# Patient Record
Sex: Female | Born: 1935 | Race: Black or African American | Hispanic: No | Marital: Married | Smoking: Never smoker
Health system: Southern US, Community
[De-identification: ages and names within clinical notes are randomized; demographics above are authoritative.]

## PROBLEM LIST (undated history)

## (undated) DIAGNOSIS — E785 Hyperlipidemia, unspecified: Secondary | ICD-10-CM

## (undated) DIAGNOSIS — I10 Essential (primary) hypertension: Secondary | ICD-10-CM

## (undated) DIAGNOSIS — E119 Type 2 diabetes mellitus without complications: Secondary | ICD-10-CM

## (undated) DIAGNOSIS — E039 Hypothyroidism, unspecified: Secondary | ICD-10-CM

## (undated) DIAGNOSIS — H409 Unspecified glaucoma: Secondary | ICD-10-CM

## (undated) DIAGNOSIS — F0390 Unspecified dementia without behavioral disturbance: Secondary | ICD-10-CM

## (undated) HISTORY — PX: ABDOMINAL HYSTERECTOMY: SHX81

## (undated) HISTORY — PX: CATARACT EXTRACTION, BILATERAL: SHX1313

## (undated) HISTORY — PX: BREAST REDUCTION SURGERY: SHX8

---

## 1997-04-17 ENCOUNTER — Ambulatory Visit (HOSPITAL_COMMUNITY): Admission: RE | Admit: 1997-04-17 | Discharge: 1997-04-17 | Payer: Self-pay | Admitting: Family Medicine

## 1997-09-11 ENCOUNTER — Ambulatory Visit (HOSPITAL_COMMUNITY): Admission: RE | Admit: 1997-09-11 | Discharge: 1997-09-11 | Payer: Self-pay | Admitting: Gastroenterology

## 1997-10-01 ENCOUNTER — Ambulatory Visit (HOSPITAL_COMMUNITY): Admission: RE | Admit: 1997-10-01 | Discharge: 1997-10-01 | Payer: Self-pay | Admitting: Gastroenterology

## 1997-10-01 ENCOUNTER — Encounter: Payer: Self-pay | Admitting: Gastroenterology

## 1998-01-23 ENCOUNTER — Encounter: Payer: Self-pay | Admitting: Ophthalmology

## 1998-01-23 ENCOUNTER — Ambulatory Visit (HOSPITAL_COMMUNITY): Admission: RE | Admit: 1998-01-23 | Discharge: 1998-01-23 | Payer: Self-pay | Admitting: Ophthalmology

## 1998-05-05 ENCOUNTER — Ambulatory Visit (HOSPITAL_COMMUNITY): Admission: RE | Admit: 1998-05-05 | Discharge: 1998-05-05 | Payer: Self-pay | Admitting: Family Medicine

## 1999-02-03 ENCOUNTER — Encounter: Admission: RE | Admit: 1999-02-03 | Discharge: 1999-05-04 | Payer: Self-pay | Admitting: Family Medicine

## 1999-05-06 ENCOUNTER — Ambulatory Visit (HOSPITAL_COMMUNITY): Admission: RE | Admit: 1999-05-06 | Discharge: 1999-05-06 | Payer: Self-pay | Admitting: Family Medicine

## 1999-05-06 ENCOUNTER — Encounter: Payer: Self-pay | Admitting: Family Medicine

## 2000-05-06 ENCOUNTER — Encounter: Payer: Self-pay | Admitting: Family Medicine

## 2000-05-06 ENCOUNTER — Ambulatory Visit (HOSPITAL_COMMUNITY): Admission: RE | Admit: 2000-05-06 | Discharge: 2000-05-06 | Payer: Self-pay | Admitting: Family Medicine

## 2000-10-10 ENCOUNTER — Encounter: Admission: RE | Admit: 2000-10-10 | Discharge: 2000-10-10 | Payer: Self-pay | Admitting: Family Medicine

## 2000-10-10 ENCOUNTER — Encounter: Payer: Self-pay | Admitting: Family Medicine

## 2001-03-15 ENCOUNTER — Ambulatory Visit (HOSPITAL_COMMUNITY): Admission: RE | Admit: 2001-03-15 | Discharge: 2001-03-15 | Payer: Self-pay | Admitting: Family Medicine

## 2001-05-08 ENCOUNTER — Ambulatory Visit (HOSPITAL_COMMUNITY): Admission: RE | Admit: 2001-05-08 | Discharge: 2001-05-08 | Payer: Self-pay | Admitting: Family Medicine

## 2001-05-08 ENCOUNTER — Encounter: Payer: Self-pay | Admitting: Family Medicine

## 2002-05-16 ENCOUNTER — Encounter: Payer: Self-pay | Admitting: Family Medicine

## 2002-05-16 ENCOUNTER — Ambulatory Visit (HOSPITAL_COMMUNITY): Admission: RE | Admit: 2002-05-16 | Discharge: 2002-05-16 | Payer: Self-pay | Admitting: Family Medicine

## 2003-05-20 ENCOUNTER — Ambulatory Visit (HOSPITAL_COMMUNITY): Admission: RE | Admit: 2003-05-20 | Discharge: 2003-05-20 | Payer: Self-pay | Admitting: Family Medicine

## 2003-11-28 ENCOUNTER — Encounter: Admission: RE | Admit: 2003-11-28 | Discharge: 2003-11-28 | Payer: Self-pay | Admitting: Family Medicine

## 2004-05-22 ENCOUNTER — Ambulatory Visit (HOSPITAL_COMMUNITY): Admission: RE | Admit: 2004-05-22 | Discharge: 2004-05-22 | Payer: Self-pay | Admitting: Family Medicine

## 2004-09-10 ENCOUNTER — Ambulatory Visit (HOSPITAL_COMMUNITY): Admission: RE | Admit: 2004-09-10 | Discharge: 2004-09-10 | Payer: Self-pay | Admitting: Ophthalmology

## 2005-04-13 ENCOUNTER — Ambulatory Visit (HOSPITAL_COMMUNITY): Admission: RE | Admit: 2005-04-13 | Discharge: 2005-04-13 | Payer: Self-pay | Admitting: Ophthalmology

## 2005-05-25 ENCOUNTER — Ambulatory Visit (HOSPITAL_COMMUNITY): Admission: RE | Admit: 2005-05-25 | Discharge: 2005-05-25 | Payer: Self-pay | Admitting: Family Medicine

## 2006-05-27 ENCOUNTER — Ambulatory Visit (HOSPITAL_COMMUNITY): Admission: RE | Admit: 2006-05-27 | Discharge: 2006-05-27 | Payer: Self-pay | Admitting: Family Medicine

## 2007-06-01 ENCOUNTER — Ambulatory Visit (HOSPITAL_COMMUNITY): Admission: RE | Admit: 2007-06-01 | Discharge: 2007-06-01 | Payer: Self-pay | Admitting: Family Medicine

## 2007-07-10 ENCOUNTER — Encounter: Admission: RE | Admit: 2007-07-10 | Discharge: 2007-07-10 | Payer: Self-pay | Admitting: Surgery

## 2007-09-14 ENCOUNTER — Inpatient Hospital Stay (HOSPITAL_COMMUNITY): Admission: RE | Admit: 2007-09-14 | Discharge: 2007-09-16 | Payer: Self-pay | Admitting: Surgery

## 2007-10-11 ENCOUNTER — Inpatient Hospital Stay (HOSPITAL_COMMUNITY): Admission: AD | Admit: 2007-10-11 | Discharge: 2007-10-17 | Payer: Self-pay | Admitting: Surgery

## 2007-11-23 ENCOUNTER — Ambulatory Visit (HOSPITAL_COMMUNITY): Admission: RE | Admit: 2007-11-23 | Discharge: 2007-11-23 | Payer: Self-pay | Admitting: Ophthalmology

## 2007-11-23 IMAGING — CT CT ORBIT/TEMPORAL/IAC WO/W CM
2 of 4 series · 16 of 30 positions shown, 19 images · IV contrast (agent unspecified)
Comparison: None

CLINICAL DATA: Loss of vision in the left eye.  Question CVA.

CT HEAD WITHOUT AND WITH CONTRAST
TECHNIQUE: Contiguous axial images were obtained from the base of
the skull through the vertex without and with intravenous
contrast.,Technique:  Multidetector CT imaging of the orbits and/or
temporal bones was performed without and with intravenous cont
Contrast: 100 ml [MT]
TECHNIQUE: Multidetector CT imaging of the orbits was performed
following the bolus administration of intravenous contrast.

[Series 6: orbit 2.0 h32s · axial · 0.29mm/px · z∈[-238,-104]mm · 9 of 85 slices shown, 12 images (1 of 2)]
[im 9/85  brain]
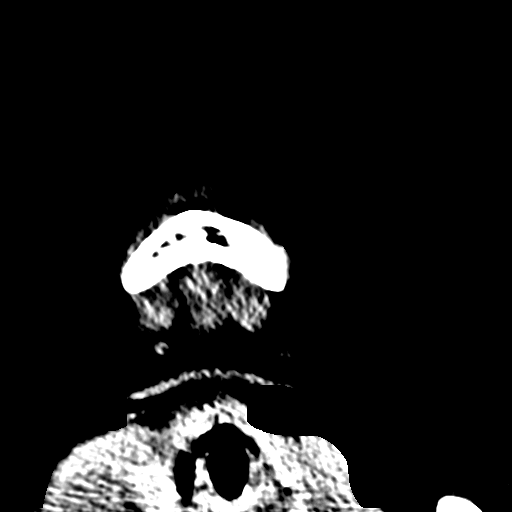
[im 9/85  bone]
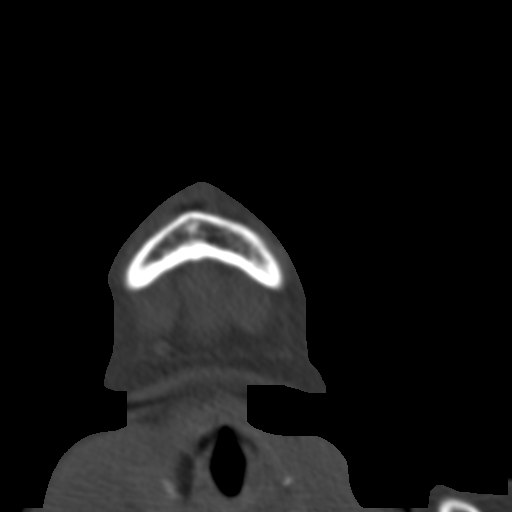
[im 17/85  bone]
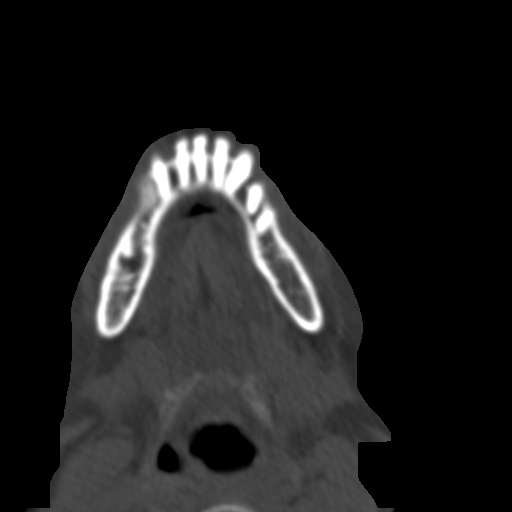
[im 26/85  bone]
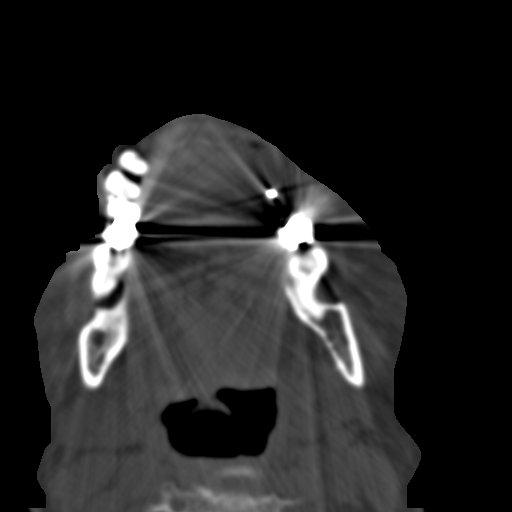
[im 34/85  bone]
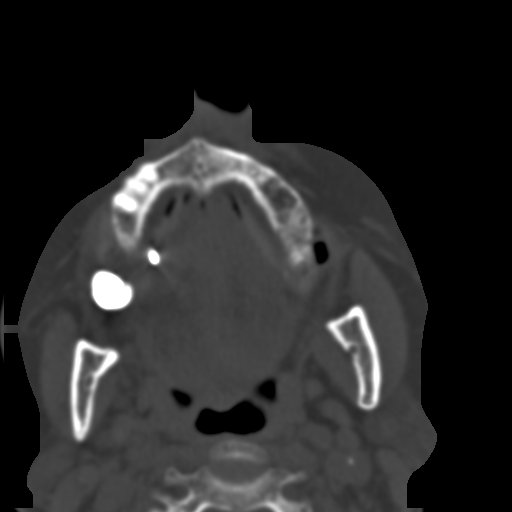
[im 43/85  brain]
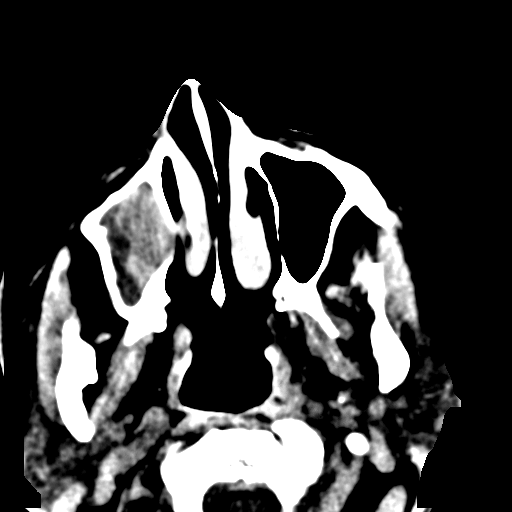
[im 43/85  bone]
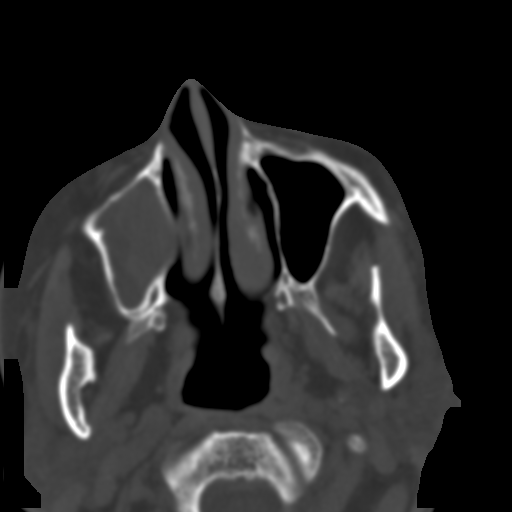
[im 51/85  bone]
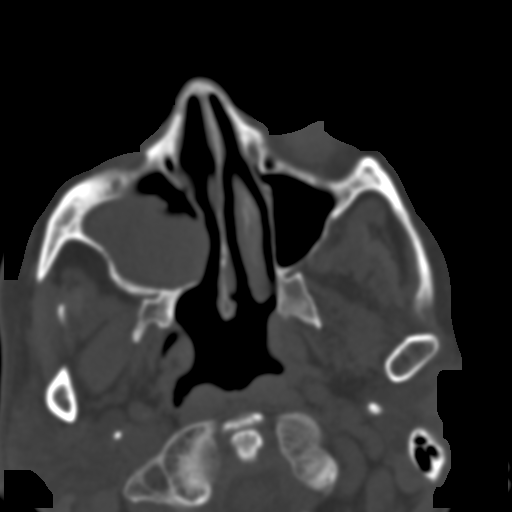
[im 59/85  bone]
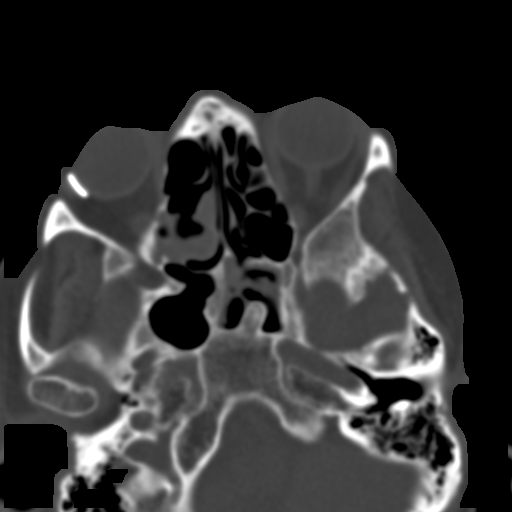
[im 68/85  bone]
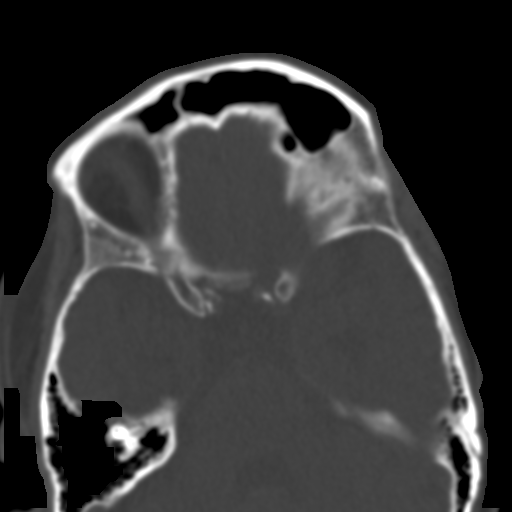
[im 76/85  brain]
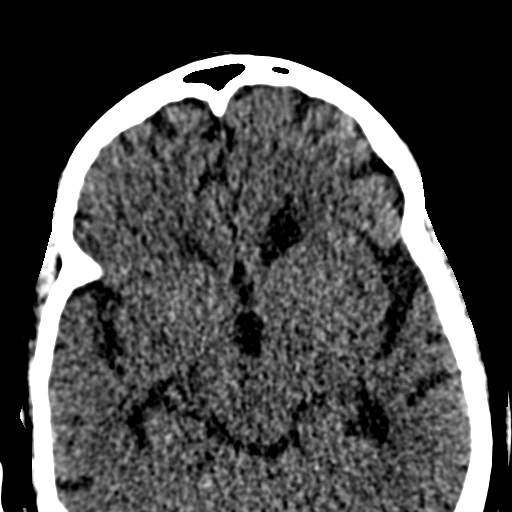
[im 76/85  bone]
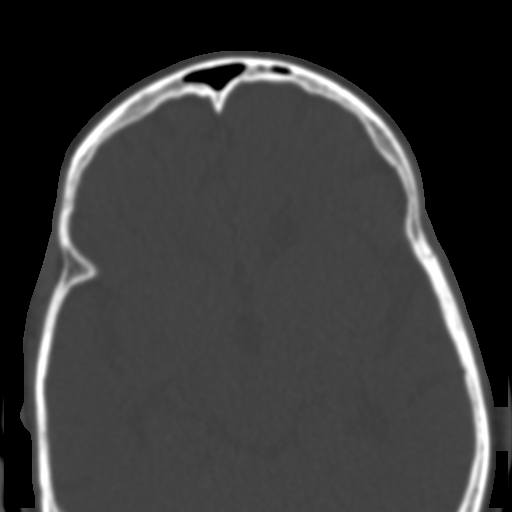

[Series 10: orbit 2.0 h32s · axial · 0.29mm/px · z∈[-238,-120]mm · 7 of 85 slices shown (2 of 2)]
[im 9/85  bone]
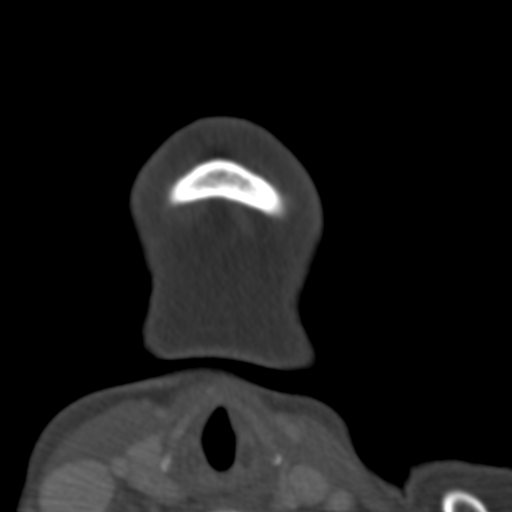
[im 17/85  bone]
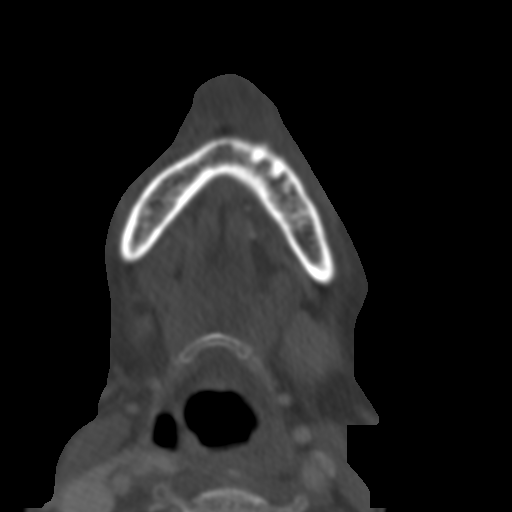
[im 26/85  bone]
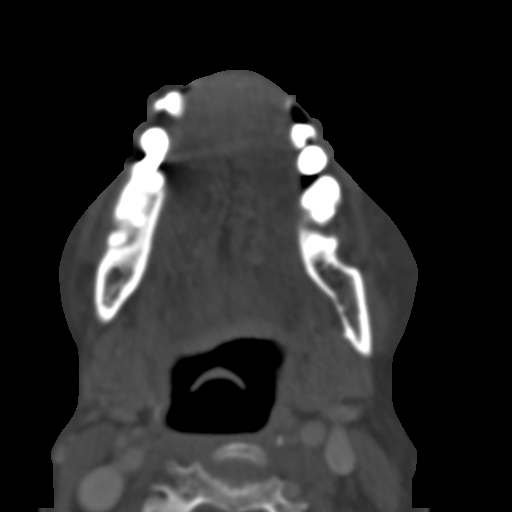
[im 34/85  bone]
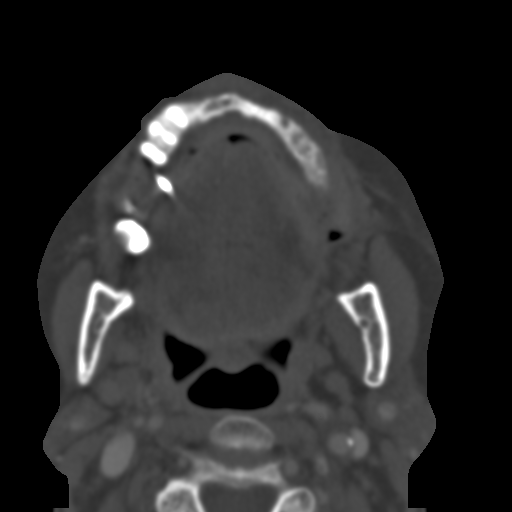
[im 51/85  bone]
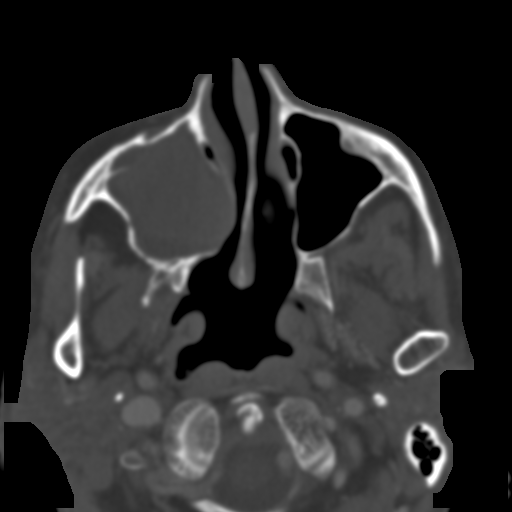
[im 59/85  bone]
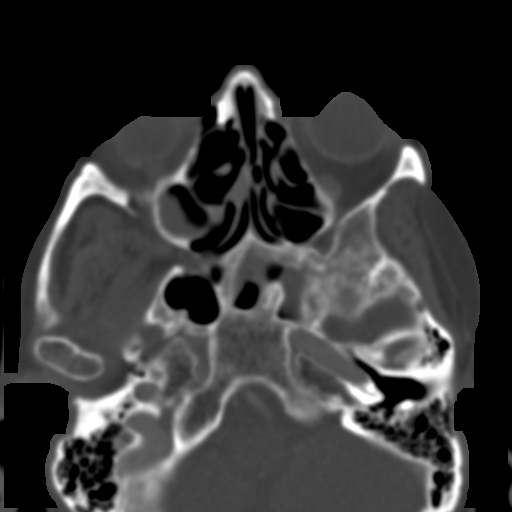
[im 68/85  bone]
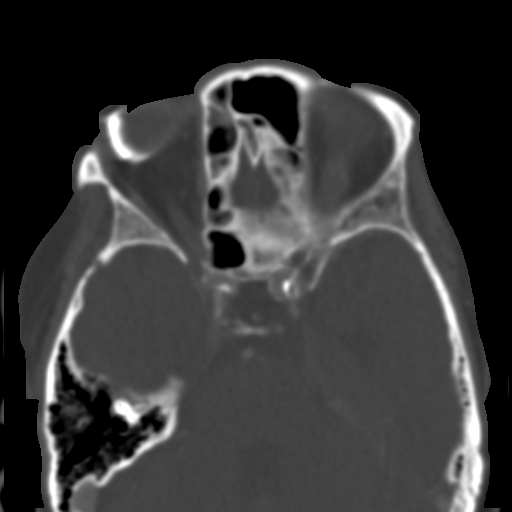

[16 of 30 positions shown; findings below may reference images not displayed]

FINDINGS: There is a small lacunar infarct noted in the right
thalamus, likely old. No acute intracranial abnormality.
Specifically, no hemorrhage, hydrocephalus, mass lesion, acute
infarction, or significant intracranial injury.  No acute calvarial
abnormality. No enhancing mass.

Soft tissue fills the right maxillary sinus and scattered right
ethmoid air cells.  Disease also noted in a posterior left ethmoid
air cell.  Findings most compatible with chronic sinusitis.
IMPRESSION: No acute intracranial abnormality.

MRI would be more sensitive for acute infarct and may be helpful in
this case.

CT ORBITS WITHOUT AND WITH CONTRAST
FINDINGS: There is a prosthesis along the surface of the right
globe.  Both globes otherwise are unremarkable.  No evidence of
orbital emphysema.  No enhancing abnormality or focal fluid
collection to suggest abscess.  No acute bony abnormality.

Extensive chronic sinusitis changes in the right maxillary sinus,
scattered ethmoid air cells, and left sphenoid sinus.  No air fluid
levels seen to suggest acute sinusitis.
IMPRESSION: No acute bony abnormality or findings within the orbits.

Prosthesis along the surface of the right globe.

Chronic sinusitis.

## 2007-11-27 ENCOUNTER — Ambulatory Visit (HOSPITAL_COMMUNITY): Admission: RE | Admit: 2007-11-27 | Discharge: 2007-11-27 | Payer: Self-pay | Admitting: Ophthalmology

## 2008-06-03 ENCOUNTER — Ambulatory Visit (HOSPITAL_COMMUNITY): Admission: RE | Admit: 2008-06-03 | Discharge: 2008-06-03 | Payer: Self-pay | Admitting: Family Medicine

## 2009-06-11 ENCOUNTER — Ambulatory Visit (HOSPITAL_COMMUNITY): Admission: RE | Admit: 2009-06-11 | Discharge: 2009-06-11 | Payer: Self-pay | Admitting: Family Medicine

## 2009-10-06 ENCOUNTER — Encounter: Admission: RE | Admit: 2009-10-06 | Discharge: 2009-10-06 | Payer: Self-pay | Admitting: Family Medicine

## 2010-05-06 ENCOUNTER — Other Ambulatory Visit (HOSPITAL_COMMUNITY): Payer: Self-pay | Admitting: Family Medicine

## 2010-05-06 DIAGNOSIS — Z1231 Encounter for screening mammogram for malignant neoplasm of breast: Secondary | ICD-10-CM

## 2010-06-09 NOTE — H&P (Signed)
NAMESTEPHANEY, [name]          ACCOUNT NO.:  [id number]   MEDICAL RECORD NO.:  [id number]          PATIENT TYPE:  INP   LOCATION:  1333                         FACILITY:  [facility]   PHYSICIAN:  Velora Heckler, MD      DATE OF BIRTH:  [birth year]   DATE OF ADMISSION:  10/11/2007  DATE OF DISCHARGE:                              HISTORY & PHYSICAL   CHIEF COMPLAINT:  Draining abdominal wound.   HISTORY OF PRESENT ILLNESS:  Santrice Muzio is a 77 year old black  female from [place], [place].  She underwent repair of  incarcerated ventral incisional hernia with polypropylene mesh on September 14, 2007, at [facility].  Postoperatively she has  developed breakdown of the inferior portion of the wound.  She has  developed drainage.  She now has an infected seroma extending to the  underlying mesh.  She is admitted at this time for wound care,  intravenous antibiotics, and operative debridement with initiation of  VAC dressing changes.   PAST MEDICAL HISTORY:  1. Status post breast reduction.  2. Status post cataract surgery.  3. Status post abdominal hysterectomy.   MEDICATIONS:  Metformin, glimepiride, simvastatin, enalapril, baby  aspirin, hydrochlorothiazide, vitamin supplements, insulin.   ALLERGIES:  None known.   SOCIAL HISTORY:  Patient is married.  She lives in [place].  She  denies tobacco use.  She denies alcohol use.   FAMILY HISTORY:  Notable for diabetes in multiple family members.   REVIEW OF SYSTEMS:  A 15-system reviewed, documented in our medical  record without significant other findings except as noted above.   PHYSICAL EXAMINATION:  GENERAL:  A 77 year old black female alert,  oriented.  HEENT:  Shows her to be normocephalic.  Sclerae clear.  Dentition poor.  Mucous membranes moist.  Voice normal.  Auscultation of the chest shows  good breath sounds bilaterally.  CARDIAC:  Exam shows regular rate and rhythm.  ABDOMEN:   Examination of the abdomen shows a healing surgical wound in  the midline.  There are a few Steri-Strips remaining in place.  However,  at the inferior portion of the wound, the skin edges have separated and  there is some subcutaneous liquefying of adipose tissue.  There is  cloudy drainage.  Wound is probed with a Q-tip, and this extends down to  the underlying anterior abdominal wall and mesh.  Fluid appears quite  cloudy.  Dry dressing is placed.  EXTREMITIES:  Nontender without edema.  NEUROLOGICALLY:  The patient is alert and oriented without focal  deficit.   DATA REVIEWED:  None.   DATA REQUESTED:  Admission laboratory studies will be obtained.   IMPRESSION:  Infected seroma following incarcerated ventral incisional  hernia repair of mesh.   PLAN:  The patient will start on intravenous antibiotics.  She will be  prepared and taken to the operating room on September 17 for debridement  and likely placement of a VAC dressing.      Velora Heckler, MD  Electronically Signed     TMG/MEDQ  D:  10/11/2007  T:  10/12/2007  Job:  [id number]  cc:   Betti D. Pecola Leisure, M.D.  Fax: [phone]

## 2010-06-09 NOTE — Op Note (Signed)
NAMEMarland Kitchen  ADANYA, SOSINSKI          ACCOUNT NO.:  [id number]   MEDICAL RECORD NO.:  [id number]          PATIENT TYPE:  INP   LOCATION:  0005                         FACILITY:  [facility]   PHYSICIAN:  Velora Heckler, MD      DATE OF BIRTH:  Feb 24, 1932   DATE OF PROCEDURE:  09/14/2007  DATE OF DISCHARGE:                               OPERATIVE REPORT   PREOPERATIVE DIAGNOSIS:  Incarcerated ventral incisional hernia.   POSTOPERATIVE DIAGNOSIS:  Incarcerated ventral incisional hernia.   PROCEDURE:  Repair incarcerated ventral incisional hernia with  polypropylene mesh.   SURGEON:  Velora Heckler, MD, FACS   ANESTHESIA:  General per Dr. Brayton Caves.   ESTIMATED BLOOD LOSS:  Minimal.   PREPARATION:  Betadine.   COMPLICATIONS:  None.   INDICATIONS:  The patient is a 77 year old black female from [place],  [place].  She has had a hernia for approximately 10 years.  It  has been largely asymptomatic.  It has gradually increased in size.  CT  scan abdomen and pelvis obtained preoperatively shows incarcerated colon  and small bowel.  The patient now comes to surgery for repair.   DESCRIPTION OF PROCEDURE:  Procedure was done in OR #11 at the [facility].  The patient is placed in a supine position on  the operating room table.  Following administration of general  anesthesia the patient is prepped and draped in usual strict aseptic  fashion.  After ascertaining that an adequate level of anesthesia had  been achieved, a midline incision is made with a #10 blade.  Dissection  is carried through subcutaneous tissues and the hernia sac is quickly  encountered.  Hernia sac is opened.  It contains transverse colon and  several loops of small bowel.  Hernia sac is opened widely.  Skin edges  are separated completely and dissection carried down through  subcutaneous tissues to the fascia.  Moderate lysis of adhesions is  required to free the small bowel from the  colon and then to free the  viscera from the margins of the hernia defect.  The hernia defect is  enlarged inferiorly in the midline.  This allows for lysis of adhesions  to the abdominal wall and reduction of the colon and small bowel back  within the peritoneal cavity.  A small approximately 2 cm serosal tear  is made to the transverse colon and this is repaired with interrupted 3-  0 silk sutures.   Next, the fascial edges are debrided circumferentially.  Midline wound  is then closed with interrupted #1 Novofil simple sutures.  Relaxing  incisions are made bilaterally over the anterior rectus sheath.  The  skin flaps are then developed circumferentially using the electrocautery  to develop the fascial plane.  Good hemostasis is achieved.   Next, a large sheet of polypropylene mesh is brought on the field and  fashioned into the appropriate dimensions for an onlay on the anterior  abdominal wall.  It is secured to the fascia circumferentially with  interrupted 0 Novofil sutures.  Mesh is also secured to the relaxing  incisions and to  the midline closure with interrupted 0 Novofil sutures.  A 19-French Blake drains are  brought in from inferior stab wounds and  laid anterior to the mesh.  They are secured to the skin with 3-0 nylon  sutures.  Umbilicus is reaffixed to the abdominal wall with a 2-0 silk  suture.  Subcutaneous tissues are closed with interrupted 3-0 Vicryl  sutures.  Skin is closed with stainless steel staples.  Drains are  placed to bulb suction.  Dressings are applied.  The patient is awakened  from anesthesia and brought to the recovery room.  The patient tolerated  the procedure well.      Velora Heckler, MD  Electronically Signed     TMG/MEDQ  D:  09/14/2007  T:  09/14/2007  Job:  [id number]   cc:   Jocelyn Lamer D. Pecola Leisure, M.D.  Fax: [phone]

## 2010-06-09 NOTE — Op Note (Signed)
NAMELESLEYANN, FICHTER          ACCOUNT NO.:  [id number]   MEDICAL RECORD NO.:  [id number]          PATIENT TYPE:  INP   LOCATION:  1333                         FACILITY:  [facility]   PHYSICIAN:  Velora Heckler, MD      DATE OF BIRTH:  May 07, 1932   DATE OF PROCEDURE:  10/12/2007  DATE OF DISCHARGE:                               OPERATIVE REPORT   PREOPERATIVE DIAGNOSIS:  Infected abdominal wound.   POSTOPERATIVE DIAGNOSIS:  Infected abdominal wound.   PROCEDURE:  1. Debridement of abdominal wound with removal of polypropylene mesh.  2. VAC dressing placement.   SURGEON:  Velora Heckler, MD, FACS   ANESTHESIA:  General.   ESTIMATED BLOOD LOSS:  Minimal.   PREPARATION:  Betadine.   COMPLICATIONS:  None.   INDICATIONS:  The patient is a 77 year old black female who had  undergone repair of ventral incisional hernia with mesh on August 20 at  [facility].  During her postoperative course her  wound healing was complicated by breakdown of the lower extent of the  incision.  This gradually progressed with fatty necrosis and subsequent  development of wound infection.  The patient is now brought to the  operating room for wound debridement and placement of a VAC dressing.   PROCEDURE IN DETAIL:  The procedure was done in OR #6 at the [facility].  The patient is brought to the operating room,  placed in a supine position on the operating room table.  Following  administration of general anesthesia the patient is prepped and draped  in the usual strict aseptic fashion.  After ascertaining that an  adequate level of anesthesia had been achieved, the midline abdominal  incision is opened with blunt dissection and use of the electrocautery.  Upon entering the subcutaneous space there is purulent fluid.  Aerobic  and anaerobic cultures are taken and submitted to the laboratory.  Fluid  is evacuated.  The wound is opened widely.  The polypropylene  mesh is  about 60% incorporated across the upper abdomen but the lower 40% of the  mesh is not incorporated at the site of infection.  Therefore the  decision is made to remove the mesh in its entirety to avoid prolonged  chronic infection.  Therefore suture material was excised and the mesh  is bluntly taken off of the abdominal wall.  This is difficult in the  upper abdomen where the mesh is actually well incorporated.  The entire  mesh was removed.  Suture material was removed.  The midline abdominal  closure appears to be intact.  No sign of recurrent hernia.  The abdomen  was copiously irrigated with warm saline which is evacuated.  Very good  hemostasis is noted.  All foreign material is removed.  Dressing is then  placed using a large VAC sponge in the subcutaneous space and a second  small VAC sponge between the skin edges.  Occlusive dressings were  placed across the anterior abdominal wall in the usual fashion and then  suction is placed to the dressing and this is placed to the portable  pump.   The patient is awakened from anesthesia and brought to the recovery room  in stable condition.  The patient tolerated the procedure well.      Velora Heckler, MD  Electronically Signed     TMG/MEDQ  D:  10/12/2007  T:  10/14/2007  Job:  [id number]

## 2010-06-12 NOTE — Discharge Summary (Signed)
NAMETAKINA, BUSSER          ACCOUNT NO.:  [id number]   MEDICAL RECORD NO.:  [id number]          PATIENT TYPE:  INP   LOCATION:  1333                         FACILITY:  [facility]   PHYSICIAN:  Velora Heckler, MD      DATE OF BIRTH:  May 05, 1932   DATE OF ADMISSION:  10/11/2007  DATE OF DISCHARGE:  10/17/2007                               DISCHARGE SUMMARY   REASON FOR ADMISSION:  Infected abdominal wound.   HISTORY OF PRESENT ILLNESS:  The patient is a 77 year old black female  from [place], [place].  She underwent repair of an  incarcerated ventral incisional hernia with polypropylene mesh on September 14, 2007, at [facility].  Postoperatively, the  patient has had a progressive breakdown of the inferior portion of the  wound.  She has developed drainage.  She is admitted on the surgical  service for wound care, intravenous antibiotics, and operative  debridement.   HOSPITAL COURSE:  The patient was admitted on the surgical service and  taken to the operating room on October 12, 2007.  She underwent  debridement of an abdominal wound with removal of the mesh.  A VAC  dressing was placed.  Postoperatively, she received intravenous  antibiotics and local wound care.  Wound ostomy care nurse was involved  with her assessment and initial dressing changes.  Home health nursing  was consulted, and the patient was prepared for discharge by the 5th  postoperative day.   DISCHARGE/PLANNING:  The patient is discharged home on October 17, 2007, in good condition, tolerating a regular diet, and ambulating  independently.  Home health nurses from [facility] will provide  wound care using the [facility] dressing.  The patient will return to see me at  [facility] in 2 weeks for wound assessment.   FINAL DIAGNOSIS:  Infected abdominal wound with removal of prosthesis.   CONDITION ON DISCHARGE:  Improved.      Velora Heckler, MD  Electronically Signed     TMG/MEDQ  D:  10/30/2007  T:  10/30/2007  Job:  [id number]

## 2010-06-12 NOTE — Op Note (Signed)
NAME:  [name], [name]          ACCOUNT NO.:  [id number]   MEDICAL RECORD NO.:  [id number]          PATIENT TYPE:  OIB   LOCATION:  2859                         FACILITY:  [facility]   PHYSICIAN:  Salley Scarlet., M.D.DATE OF BIRTH:  01-14-1933   DATE OF PROCEDURE:  09/10/2004  DATE OF DISCHARGE:  09/10/2004                                 OPERATIVE REPORT   PREOPERATIVE DIAGNOSIS:  Pseudophakic glaucoma, advanced, poor control,  right eye.   POSTOPERATIVE DIAGNOSIS:  Pseudophakic glaucoma, advanced, poor control,  right eye.   OPERATION:  Trabeculectomy, right eye, with Mitomycin C.   ANESTHESIA:  Local using Xylocaine 2% with Marcaine 0.75% and Wydase.   JUSTIFICATION OF PROCEDURE:  This is a 77 year old lady who has had cataract  surgery on the right eye several years ago and who has been treated for  several years for glaucoma.  The glaucoma in the right eye has become  increasingly more difficult to control as she has had a progressive decrease  in vision.  She has seen Dr. Luciana Axe in consultation and Dr. Dagoberto Ligas.  I  have carefully explained the situation to her and given her the option of no  treatment or trabeculectomy.  She has opted for trabeculectomy at this time.  She has a visual acuity of hand motion in the eye with intraocular pressure  on maximal tolerated medical therapy of 24.  She has band keratopathy of the  right eye with a poor view into the fundus.  She is a diabetic but her sugar  is presently well controlled.   PROCEDURE:  Under the influence of IV sedation, a Van Lint akinesia and  retrobulbar anesthesia was given.  The patient was prepped and draped in the  usual manner.  The lid speculum was inserted under the upper and lower lid  of the right eye and a 4-0 silk traction suture was passed through the belly  of the superior rectus muscle for retraction.  A limbal based conjunctival  flap was turned and hemostasis achieved using cautery.  A  triangular scleral  flap was fashioned at the 12 o'clock position with the base of the triangle  hinged at the limbus.  After fashioning the flap, a Weck-Cel pledget soaked  in Mitomycin C was allowed to sit on the sclera beneath the conjunctiva for  approximately two minutes.  At the end of this two minutes time, the  Mitomycin C was thoroughly irrigated from the eye.  Then, using a crescent  blade, the triangular scleral flap was dissected down into clear cornea.  Beneath the scleral flap, a rectangular segment of tissue was excised, this  contained trabecular mesh work.  This resulted in entrance to the anterior  chamber.  A peripheral iridectomy was made.  The anterior chamber was  reformed using Miochol.  Ocucoat was then injected into the eye so as to  maintain a deep anterior chamber.  Two apices of the triangular scleral flap  was tacked down to the sclera using 10-0 nylon.  The conjunctiva and Tenon's  capsule was closed over the sclera using a running suture of 8-0 Vicryl.  After the wound had been adequately closed, 1 mL of Celestone and 0.5 mL of  gentamicin were injected subconjuctivally.  Atropine drops and Maxitrol  ointment were applied along with a patch and a Fox shield.  The patient  tolerated the procedure well and was discharged to the post  anesthesia recovery room in satisfactory condition.  She is instructed to  rest today, to take Vicodin every 4 hours as needed for pain, and to see me  in the office tomorrow for further evaluation.   DISCHARGE DIAGNOSIS:  Glaucoma, advanced, poorly controlled, right eye.      Salley Scarlet., M.D.  Electronically Signed     TB/MEDQ  D:  09/10/2004  T:  09/10/2004  Job:  [id number]

## 2010-06-12 NOTE — Discharge Summary (Signed)
[name], [name]          ACCOUNT NO.:  [id number]   MEDICAL RECORD NO.:  [id number]          PATIENT TYPE:  INP   LOCATION:  1522                         FACILITY:  [facility]   PHYSICIAN:  Velora Heckler, MD      DATE OF BIRTH:  August 17, 1932   DATE OF ADMISSION:  09/14/2007  DATE OF DISCHARGE:  09/16/2007                               DISCHARGE SUMMARY   REASON FOR ADMISSION:  Ventral incisional hernia.   HISTORY OF PRESENT ILLNESS:  The patient is a 77 year old black female  from [place], [place].  She is referred by Dr. Haskel Schroeder for  a ventral hernia.  This has been present for over 10 years and gradually  increasing in size.  It has been largely asymptomatic.  The patient has  developed pain.  She now comes to surgery for repair.   HOSPITAL COURSE:  The patient was admitted on September 14, 2007.  She was  taken to the operating room where she underwent open repair of  incarcerated ventral incisional hernia with polypropylene mesh.  Postoperative course was uncomplicated.  The patient was started on a  clear liquid diet on the first postoperative day.  She was discharged to  a regular diet which she tolerated well.  Pain was well-controlled.  The  patient was instructed in drain care.  She was prepared for discharge  home on September 16, 2007.   DISCHARGE/PLAN:  The patient was discharged home September 16, 2007 in good  condition, tolerating a regular diet.  The patient will wear an  abdominal binder at all times.  She will take Vicodin as needed for  pain.  The patient will be seen back in my office at [facility] in 5 days for wound care and drain removal.   FINAL DIAGNOSIS:  Incarcerated ventral incisional hernia.   CONDITION AT DISCHARGE:  Good.      Velora Heckler, MD  Electronically Signed     TMG/MEDQ  D:  10/30/2007  T:  10/30/2007  Job:  [id number]

## 2010-06-12 NOTE — Op Note (Signed)
NAME:  [name], [name]          ACCOUNT NO.:  [id number]   MEDICAL RECORD NO.:  [id number]          PATIENT TYPE:  AMB   LOCATION:  SDS                          FACILITY:  [facility]   PHYSICIAN:  Alford Highland. Rankin, M.D.   DATE OF BIRTH:  [birth year]   DATE OF PROCEDURE:  04/13/2005  DATE OF DISCHARGE:                                 OPERATIVE REPORT   PREOPERATIVE DIAGNOSIS:  1.  Uveitis, right eye with  2.  Secondary glaucoma, right eye.  3.  History of progressive proliferative diabetic retinopathy, right eye.   POSTOPERATIVE DIAGNOSIS:   OPERATION PERFORMED:  1.  Posterior vitrectomy with panretinal photocoagulation, right eye.  2.  Placement of glaucoma seton--aqueous shunt--Baerveldt style [id number]      via the pars plana, right eye.   SURGEON:  Alford Highland. Rankin, M.D.   ANESTHESIA:  Local retrobulbar with monitored anesthesia control.   INDICATIONS FOR PROCEDURE:  The patient is a 77 year old woman who has optic  atrophic, progressive glaucoma, uncontrolled with maximal tolerated medical  therapy as well as underlying diabetic retinopathy advanced, status post  previous panretinal photocoagulation.  The patient understands this is an  attempt to not only deliver definitive panretinal photocoagulation but also  to treat the underlying glaucoma with a glaucoma shunt because of the high  risk of failure of past trabeculectomies which have failed.  The patient  understands the risks of anesthesia including the rare occurrence of death  but also to the eye including but not limited to hemorrhage, infection,  scarring, need for another surgery, no change in vision, loss of vision and  progression of disease despite intervention.  After appropriate signed  consent was obtained, the patient was taken to the operating room.   DESCRIPTION OF PROCEDURE:  In the operating room, appropriate monitoring was  followed by mild sedation.  0.75% Marcaine was delivered 5 mL retrobulbar  followed  by an additional 5 mL laterally in the fashion of modified Darel Hong.  The right periocular region was sterilely prepped and draped in the  usual ophthalmic fashion.  A lid speculum was applied.  Conjunctival  peritomy was fashioned superotemporally and superonasally and  inferotemporally.  The infusion secured 3.5 mm posterior to the limbus in  the infratemporal quadrant.  Placement in the vitreous cavity verified  visually.  Superior sclerotomies then fashioned.  Care was taken to place  the superotemporal one in the midposition of the quadrant for later  placement of the foot plate.  It must be noted that the lateral and superior  rectus muscles were isolated on 2-0 silk ties.  Intermuscular septum was  opened.   At this time core vitrectomy was then begun. Biom attachment with the  microscope was used.  Vitreous skirt trimmed 360 degrees.  Endolaser  panphotocoagulation placed 360 degrees in panretinal fashion.  At this time  superonasal sclerotomy closed with 7-0 Vicryl.  The glaucoma seton baseplate  was then placed in the superotemporal quadrant secured with 8-0 nylon  sutures.  Ligature was applied to the tube with 7-0 Vicryl. The footplate  was then placed through the pars plana  and secured with 8-0 nylon suture.  Tutoplast was then used overlying the foot plate and covered with vertical  mattress sutures.  At this time conjunctiva then brought forward and closed  with interrupted layers of 7-0 Vicryl.  Tenon's was also closed over the  footplate.  Subconjunctival injection of antibiotic and steroid applied.  It  must be noted that the infusion had been previously removed prior to  placement of the final sutures to close the conjunctiva.  This had been  closed with 7-0 Vicryl suture.  BSS was then insufflated through a  paracentesis incision to confirm that in fact the tube was opened but only  under high pressures.   At this time a sterile patch and Fox shield were applied. The  patient then  transferred to the short stay area to be discharged home as an outpatient.      Alford Highland Rankin, M.D.  Electronically Signed     GAR/MEDQ  D:  04/13/2005  T:  04/14/2005  Job:  [id number]   cc:   Salley Scarlet., M.D.  Fax: [phone]

## 2010-06-15 ENCOUNTER — Ambulatory Visit (HOSPITAL_COMMUNITY)
Admission: RE | Admit: 2010-06-15 | Discharge: 2010-06-15 | Disposition: A | Discharge status: Home / Self Care | Payer: Medicare Other | Source: Ambulatory Visit | Attending: Family Medicine | Admitting: Family Medicine

## 2010-06-15 DIAGNOSIS — Z1231 Encounter for screening mammogram for malignant neoplasm of breast: Secondary | ICD-10-CM | POA: Insufficient documentation

## 2010-10-23 LAB — URINALYSIS, ROUTINE W REFLEX MICROSCOPIC
Glucose, UA: NEGATIVE
Nitrite: NEGATIVE
Urobilinogen, UA: 1
pH: 7

## 2010-10-23 LAB — DIFFERENTIAL
Basophils Absolute: 0
Basophils Relative: 1
Eosinophils Absolute: 0.1
Eosinophils Relative: 2
Neutrophils Relative %: 66

## 2010-10-23 LAB — COMPREHENSIVE METABOLIC PANEL
Alkaline Phosphatase: 59
BUN: 23
Chloride: 106
Creatinine, Ser: 1.17
Glucose, Bld: 95
Potassium: 4.1
Total Bilirubin: 0.6

## 2010-10-23 LAB — CBC
HCT: 34 — ABNORMAL LOW
Hemoglobin: 11.1 — ABNORMAL LOW
RDW: 14.2
WBC: 6

## 2010-10-23 LAB — PROTIME-INR: Prothrombin Time: 13

## 2010-10-23 LAB — URINE MICROSCOPIC-ADD ON

## 2010-10-26 LAB — COMPREHENSIVE METABOLIC PANEL
AST: 14
AST: 17
Albumin: 2.2 — ABNORMAL LOW
Albumin: 2.5 — ABNORMAL LOW
Alkaline Phosphatase: 52
BUN: 22
Calcium: 9.2
Chloride: 102
Chloride: 105
Creatinine, Ser: 1.11
GFR calc Af Amer: 35 — ABNORMAL LOW
GFR calc Af Amer: 58 — ABNORMAL LOW
Potassium: 3.8
Sodium: 133 — ABNORMAL LOW
Total Bilirubin: 0.6
Total Bilirubin: 1
Total Protein: 5.8 — ABNORMAL LOW
Total Protein: 6.2

## 2010-10-26 LAB — GLUCOSE, CAPILLARY
Glucose-Capillary: 102 — ABNORMAL HIGH
Glucose-Capillary: 127 — ABNORMAL HIGH
Glucose-Capillary: 140 — ABNORMAL HIGH
Glucose-Capillary: 154 — ABNORMAL HIGH
Glucose-Capillary: 158 — ABNORMAL HIGH
Glucose-Capillary: 172 — ABNORMAL HIGH
Glucose-Capillary: 190 — ABNORMAL HIGH
Glucose-Capillary: 204 — ABNORMAL HIGH
Glucose-Capillary: 219 — ABNORMAL HIGH
Glucose-Capillary: 234 — ABNORMAL HIGH
Glucose-Capillary: 268 — ABNORMAL HIGH
Glucose-Capillary: 63 — ABNORMAL LOW
Glucose-Capillary: 72
Glucose-Capillary: 79
Glucose-Capillary: 81

## 2010-10-26 LAB — CBC
HCT: 24.3 — ABNORMAL LOW
Hemoglobin: 8.2 — ABNORMAL LOW
Hemoglobin: 8.6 — ABNORMAL LOW
MCHC: 33.1
MCV: 86.2
MCV: 87.2
MCV: 88
Platelets: 203
Platelets: 213
RBC: 2.84 — ABNORMAL LOW
RBC: 3 — ABNORMAL LOW
RDW: 14.5
RDW: 14.5
WBC: 10.1
WBC: 10.4
WBC: 10.8 — ABNORMAL HIGH

## 2010-10-26 LAB — ABO/RH: ABO/RH(D): B POS

## 2010-10-26 LAB — DIFFERENTIAL
Eosinophils Relative: 3
Lymphocytes Relative: 8 — ABNORMAL LOW
Lymphs Abs: 0.8
Monocytes Absolute: 1.8 — ABNORMAL HIGH
Monocytes Relative: 18 — ABNORMAL HIGH
Neutro Abs: 7.2

## 2010-10-26 LAB — ANAEROBIC CULTURE

## 2010-10-26 LAB — BASIC METABOLIC PANEL
CO2: 24
Calcium: 8.5
Chloride: 105
Creatinine, Ser: 0.97
GFR calc Af Amer: >60
Sodium: 134 — ABNORMAL LOW

## 2010-10-26 LAB — TYPE AND SCREEN
ABO/RH(D): B POS
Antibody Screen: NEGATIVE

## 2010-10-26 LAB — CULTURE, ROUTINE-ABSCESS

## 2011-05-11 ENCOUNTER — Other Ambulatory Visit (HOSPITAL_COMMUNITY): Payer: Self-pay | Admitting: Ophthalmology

## 2011-05-11 DIAGNOSIS — H547 Unspecified visual loss: Secondary | ICD-10-CM

## 2011-05-17 ENCOUNTER — Ambulatory Visit (HOSPITAL_COMMUNITY)
Admission: RE | Admit: 2011-05-17 | Discharge: 2011-05-17 | Disposition: A | Discharge status: Home / Self Care | Payer: Medicare HMO | Source: Ambulatory Visit | Attending: Ophthalmology | Admitting: Ophthalmology

## 2011-05-17 ENCOUNTER — Other Ambulatory Visit (HOSPITAL_COMMUNITY): Payer: Self-pay | Admitting: Family Medicine

## 2011-05-17 DIAGNOSIS — Z1231 Encounter for screening mammogram for malignant neoplasm of breast: Secondary | ICD-10-CM

## 2011-05-17 DIAGNOSIS — G319 Degenerative disease of nervous system, unspecified: Secondary | ICD-10-CM | POA: Insufficient documentation

## 2011-05-17 DIAGNOSIS — H547 Unspecified visual loss: Secondary | ICD-10-CM | POA: Insufficient documentation

## 2011-05-17 LAB — CREATININE, SERUM
Creatinine, Ser: 1.34 mg/dL — ABNORMAL HIGH (ref 0.50–1.10)
GFR calc Af Amer: 43 mL/min — ABNORMAL LOW (ref >90)

## 2011-05-17 MED ORDER — GADOBENATE DIMEGLUMINE 529 MG/ML IV SOLN
13.0000 mL | Freq: Once | INTRAVENOUS | Status: AC | PRN
  Administered 2011-05-17: 13 mL via INTRAVENOUS

## 2011-06-16 ENCOUNTER — Ambulatory Visit (HOSPITAL_COMMUNITY): Payer: Medicare Other

## 2011-07-07 ENCOUNTER — Ambulatory Visit (HOSPITAL_COMMUNITY)
Admission: RE | Admit: 2011-07-07 | Discharge: 2011-07-07 | Disposition: A | Discharge status: Home / Self Care | Payer: Medicare HMO | Source: Ambulatory Visit | Attending: Family Medicine | Admitting: Family Medicine

## 2011-07-07 DIAGNOSIS — Z1231 Encounter for screening mammogram for malignant neoplasm of breast: Secondary | ICD-10-CM | POA: Insufficient documentation

## 2012-06-09 ENCOUNTER — Other Ambulatory Visit (HOSPITAL_COMMUNITY): Payer: Self-pay | Admitting: Family Medicine

## 2012-06-09 DIAGNOSIS — Z1231 Encounter for screening mammogram for malignant neoplasm of breast: Secondary | ICD-10-CM

## 2012-07-10 ENCOUNTER — Ambulatory Visit (HOSPITAL_COMMUNITY)
Admission: RE | Admit: 2012-07-10 | Discharge: 2012-07-10 | Disposition: A | Discharge status: Home / Self Care | Payer: Medicare HMO | Source: Ambulatory Visit | Attending: Family Medicine | Admitting: Family Medicine

## 2012-07-10 DIAGNOSIS — Z1231 Encounter for screening mammogram for malignant neoplasm of breast: Secondary | ICD-10-CM | POA: Insufficient documentation

## 2012-11-13 ENCOUNTER — Ambulatory Visit (INDEPENDENT_AMBULATORY_CARE_PROVIDER_SITE_OTHER): Payer: Medicare HMO | Admitting: Podiatry

## 2012-11-13 ENCOUNTER — Ambulatory Visit: Payer: Self-pay | Admitting: Podiatry

## 2012-11-13 ENCOUNTER — Encounter: Payer: Self-pay | Admitting: Podiatry

## 2012-11-13 VITALS — BP 147/67 | HR 62 | Resp 12 | Ht 66.0 in | Wt 135.0 lb

## 2012-11-13 DIAGNOSIS — B351 Tinea unguium: Secondary | ICD-10-CM

## 2012-11-13 DIAGNOSIS — M79609 Pain in unspecified limb: Secondary | ICD-10-CM

## 2012-11-13 NOTE — Progress Notes (Signed)
Subjective:     Patient ID: [name], female   DOB: February 08, 1932, 79 y.o.   MRN: [id number]  HPI patient presents stating all of my toenails are thick and painful and needed to be trimmed   Review of Systems  All other systems reviewed and are negative.       Objective:   Physical Exam  Nursing note and vitals reviewed. Cardiovascular: Intact distal pulses.   Musculoskeletal: Normal range of motion.   thick nailbed deformity one through 5 bilateral when pressed dorsally pain is noted     Assessment:     Mycotic nail infection one through 5 bilateral with pain    Plan:     Debridement nailbeds 1-5 bilateral no iatrogenic bleeding noted

## 2013-02-12 ENCOUNTER — Ambulatory Visit (INDEPENDENT_AMBULATORY_CARE_PROVIDER_SITE_OTHER): Payer: Medicare HMO | Admitting: Podiatry

## 2013-02-12 ENCOUNTER — Encounter: Payer: Self-pay | Admitting: Podiatry

## 2013-02-12 VITALS — BP 109/64 | HR 72 | Resp 12

## 2013-02-12 DIAGNOSIS — B351 Tinea unguium: Secondary | ICD-10-CM

## 2013-02-12 DIAGNOSIS — M79609 Pain in unspecified limb: Secondary | ICD-10-CM

## 2013-02-12 NOTE — Progress Notes (Signed)
Subjective:     Patient ID: [name], female   DOB: 13-Mar-1932, 80 y.o.   MRN: [id number]  HPI patient presents with nail disease and thickness 1-5 both feet with pain when pressed and impossible for patient to cut   Review of Systems     Objective:   Physical Exam No change in neurovascular status with thick painful nail bed 1-5 both feet    Assessment:     Mycotic nail infection with pain 1-5 both feet    Plan:     Debridement painful nail bed 1-5 both feet with no bleeding noted

## 2013-05-14 ENCOUNTER — Ambulatory Visit (INDEPENDENT_AMBULATORY_CARE_PROVIDER_SITE_OTHER): Payer: Medicare HMO | Admitting: Podiatry

## 2013-05-14 ENCOUNTER — Encounter: Payer: Self-pay | Admitting: Podiatry

## 2013-05-14 VITALS — BP 162/60 | HR 68 | Resp 12

## 2013-05-14 DIAGNOSIS — M79609 Pain in unspecified limb: Secondary | ICD-10-CM

## 2013-05-14 DIAGNOSIS — B351 Tinea unguium: Secondary | ICD-10-CM

## 2013-05-16 NOTE — Progress Notes (Signed)
Subjective:     Patient ID: [name], female   DOB: 04-08-1932, 80 y.o.   MRN: [id number]  HPI patient presents with very thick nail bed 1-5 both feet that are painful and possible for her to cut   Review of Systems     Objective:   Physical Exam Neurovascular status intact with thick painful nailbeds 1-5 both feet that are painful    Assessment:     Mycotic nail infection with pain 1-5 both feet    Plan:     Debridement painful nail bed 1-5 both feet with no bleeding noted

## 2013-06-14 ENCOUNTER — Other Ambulatory Visit (HOSPITAL_COMMUNITY): Payer: Self-pay | Admitting: Family Medicine

## 2013-06-14 DIAGNOSIS — Z1231 Encounter for screening mammogram for malignant neoplasm of breast: Secondary | ICD-10-CM

## 2013-07-13 ENCOUNTER — Ambulatory Visit (HOSPITAL_COMMUNITY)
Admission: RE | Admit: 2013-07-13 | Discharge: 2013-07-13 | Disposition: A | Discharge status: Home / Self Care | Payer: Medicare HMO | Source: Ambulatory Visit | Attending: Family Medicine | Admitting: Family Medicine

## 2013-07-13 DIAGNOSIS — Z1231 Encounter for screening mammogram for malignant neoplasm of breast: Secondary | ICD-10-CM | POA: Insufficient documentation

## 2013-08-13 ENCOUNTER — Encounter: Payer: Self-pay | Admitting: Podiatry

## 2013-08-13 ENCOUNTER — Ambulatory Visit (INDEPENDENT_AMBULATORY_CARE_PROVIDER_SITE_OTHER): Payer: Medicare HMO | Admitting: Podiatry

## 2013-08-13 DIAGNOSIS — B351 Tinea unguium: Secondary | ICD-10-CM

## 2013-08-13 DIAGNOSIS — M79609 Pain in unspecified limb: Secondary | ICD-10-CM

## 2013-08-13 DIAGNOSIS — M79673 Pain in unspecified foot: Secondary | ICD-10-CM

## 2013-08-15 NOTE — Progress Notes (Signed)
Subjective:     Patient ID: [name], female   DOB: 02/11/1932, 80 y.o.   MRN: [id number]  HPI patient presents with thick yellow nailbeds 1-5 both feet that are bothering her and she cannot cut   Review of Systems     Objective:   Physical Exam Neurovascular status intact with noted thickened elongated yellow brittle nailbeds 1-5 both feet that are painful    Assessment:     Mycotic nail infection with pain 1-5 both feet    Plan:     Debris painful nailbeds 1-5 both feet with no iatrogenic bleeding noted

## 2013-11-19 ENCOUNTER — Ambulatory Visit (INDEPENDENT_AMBULATORY_CARE_PROVIDER_SITE_OTHER): Payer: Medicare HMO | Admitting: Podiatry

## 2013-11-19 DIAGNOSIS — B351 Tinea unguium: Secondary | ICD-10-CM

## 2013-11-19 DIAGNOSIS — M79673 Pain in unspecified foot: Secondary | ICD-10-CM

## 2013-11-19 NOTE — Progress Notes (Signed)
   Subjective:    Patient ID: [name], female    DOB: Mar 22, 1932, 80 y.o.   MRN: [id number]  HPI  Pt presents for nail debridement  Review of Systems     Objective:   Physical Exam        Assessment & Plan:

## 2013-11-20 NOTE — Progress Notes (Signed)
Subjective:     Patient ID: [name], female   DOB: 07/27/1932, 80 y.o.   MRN: [id number]  HPI patient presents with painful nailbeds 1-5 both feet that she cannot cut   Review of Systems     Objective:   Physical Exam Neurovascular status are intact with thick yellow brittle nailbeds 1-5 both feet that are painful    Assessment:     Mycotic nail infection with pain 1-5 both feet    Plan:

## 2014-02-18 ENCOUNTER — Other Ambulatory Visit: Payer: Medicare HMO

## 2014-02-21 ENCOUNTER — Other Ambulatory Visit: Payer: Medicare HMO

## 2014-03-19 ENCOUNTER — Ambulatory Visit (INDEPENDENT_AMBULATORY_CARE_PROVIDER_SITE_OTHER): Payer: Medicare HMO | Admitting: Podiatry

## 2014-03-19 DIAGNOSIS — B351 Tinea unguium: Secondary | ICD-10-CM

## 2014-03-19 DIAGNOSIS — E1151 Type 2 diabetes mellitus with diabetic peripheral angiopathy without gangrene: Secondary | ICD-10-CM

## 2014-03-19 DIAGNOSIS — Q828 Other specified congenital malformations of skin: Secondary | ICD-10-CM

## 2014-03-19 DIAGNOSIS — M79673 Pain in unspecified foot: Secondary | ICD-10-CM

## 2014-03-19 NOTE — Progress Notes (Signed)
Subjective:     Patient ID: [name], female   DOB: 04/18/1932, 81 y.o.   MRN: [id number]  HPI patient presents with painful nailbeds 1-5 both feet that she cannot cut   Review of Systems     Objective:   Physical Exam Neurovascular status are intact with thick yellow brittle nailbeds 1-5 both feet that are painful    Assessment:     Mycotic nail infection with pain 1-5 both feet    Plan:           

## 2014-03-19 NOTE — Progress Notes (Signed)
Subjective:     Patient ID: Tennile B Galen, female   DOB: 11/05/1932, 81 y.o.   MRN: [id number]  HPI long-term diabetic who presents with lesions on the left fifth toe plantar aspect right foot and nail disease 1-5 both feet that are thick and painful when palpated   Review of Systems     Objective:   Physical Exam Neurovascular status moderately diminished with diminished DP and PT pulses and also noted to have keratotic lesion left fifth digit right plantar foot that are both painful and thick yellow brittle nailbeds 1-5 both feet that are painful    Assessment:     At risk diabetic with mycotic nail infection and porokeratotic lesion 2    Plan:     Debride painful nailbeds 1-5 both feet and debridement lesions on both feet with no iatrogenic bleeding noted      

## 2014-06-18 ENCOUNTER — Other Ambulatory Visit (HOSPITAL_COMMUNITY): Payer: Self-pay | Admitting: Family Medicine

## 2014-06-18 DIAGNOSIS — Z1231 Encounter for screening mammogram for malignant neoplasm of breast: Secondary | ICD-10-CM

## 2014-07-08 ENCOUNTER — Ambulatory Visit (INDEPENDENT_AMBULATORY_CARE_PROVIDER_SITE_OTHER): Payer: Medicare HMO | Admitting: Podiatry

## 2014-07-08 DIAGNOSIS — M79673 Pain in unspecified foot: Secondary | ICD-10-CM

## 2014-07-08 DIAGNOSIS — Q828 Other specified congenital malformations of skin: Secondary | ICD-10-CM | POA: Diagnosis not present

## 2014-07-08 DIAGNOSIS — B351 Tinea unguium: Secondary | ICD-10-CM | POA: Diagnosis not present

## 2014-07-08 DIAGNOSIS — E1151 Type 2 diabetes mellitus with diabetic peripheral angiopathy without gangrene: Secondary | ICD-10-CM

## 2014-07-08 NOTE — Progress Notes (Signed)
Subjective:     Patient ID: [name], female   DOB: 08-Aug-1932, 81 y.o.   MRN: [id number]  HPI long-term diabetic who presents with lesions on the left fifth toe plantar aspect right foot and nail disease 1-5 both feet that are thick and painful when palpated   Review of Systems     Objective:   Physical Exam Neurovascular status moderately diminished with diminished DP and PT pulses and also noted to have keratotic lesion left fifth digit right plantar foot that are both painful and thick yellow brittle nailbeds 1-5 both feet that are painful    Assessment:     At risk diabetic with mycotic nail infection and porokeratotic lesion 2    Plan:     Debride painful nailbeds 1-5 both feet and debridement lesions on both feet with no iatrogenic bleeding noted

## 2014-07-15 ENCOUNTER — Ambulatory Visit (HOSPITAL_COMMUNITY)
Admission: RE | Admit: 2014-07-15 | Discharge: 2014-07-15 | Disposition: A | Discharge status: Home / Self Care | Payer: Medicare HMO | Source: Ambulatory Visit | Attending: Family Medicine | Admitting: Family Medicine

## 2014-07-15 DIAGNOSIS — Z1231 Encounter for screening mammogram for malignant neoplasm of breast: Secondary | ICD-10-CM | POA: Insufficient documentation

## 2014-10-17 ENCOUNTER — Ambulatory Visit: Payer: Medicare HMO | Admitting: Podiatry

## 2014-10-18 ENCOUNTER — Ambulatory Visit: Payer: Medicare HMO | Admitting: Podiatry

## 2015-02-14 ENCOUNTER — Ambulatory Visit: Payer: Medicare HMO | Admitting: Podiatry

## 2015-02-21 ENCOUNTER — Ambulatory Visit (INDEPENDENT_AMBULATORY_CARE_PROVIDER_SITE_OTHER): Payer: Medicare HMO | Admitting: Podiatry

## 2015-02-21 ENCOUNTER — Encounter: Payer: Self-pay | Admitting: Podiatry

## 2015-02-21 DIAGNOSIS — B351 Tinea unguium: Secondary | ICD-10-CM | POA: Diagnosis not present

## 2015-02-21 DIAGNOSIS — M79673 Pain in unspecified foot: Secondary | ICD-10-CM | POA: Diagnosis not present

## 2015-02-21 NOTE — Progress Notes (Signed)
Patient ID: MYSTERY SCHRUPP, female   DOB: 08/20/1932, 82 y.o.   MRN: [id number] Complaint:  Visit Type: Patient returns to my office for continued preventative foot care services. Complaint: Patient states" my nails have grown long and thick and become painful to walk and wear shoes" Patient has been diagnosed with DM with no foot complications. The patient presents for preventative foot care services. No changes to ROS  Podiatric Exam: Vascular: dorsalis pedis and posterior tibial pulses are palpable bilateral. Capillary return is immediate. Temperature gradient is WNL. Skin turgor WNL  Sensorium: Normal Semmes Weinstein monofilament test. Normal tactile sensation bilaterally. Nail Exam: Pt has thick disfigured discolored nails with subungual debris noted bilateral entire nail hallux through fifth toenails Ulcer Exam: There is no evidence of ulcer or pre-ulcerative changes or infection. Orthopedic Exam: Muscle tone and strength are WNL. No limitations in general ROM. No crepitus or effusions noted. Foot type and digits show no abnormalities. Bony prominences are unremarkable. HAV B/L with overlapping second toe left only. Skin: No Porokeratosis. No infection or ulcers  Diagnosis:  Onychomycosis, , Pain in right toe, pain in left toes  Treatment & Plan Procedures and Treatment: Consent by patient was obtained for treatment procedures. The patient understood the discussion of treatment and procedures well. All questions were answered thoroughly reviewed. Debridement of mycotic and hypertrophic toenails, 1 through 5 bilateral and clearing of subungual debris. No ulceration, no infection noted.  Return Visit-Office Procedure: Patient instructed to return to the office for a follow up visit prn for continued evaluation and treatment.    Helane Gunther DPM

## 2016-06-10 ENCOUNTER — Emergency Department (HOSPITAL_COMMUNITY): Payer: Medicare HMO

## 2016-06-10 ENCOUNTER — Encounter (HOSPITAL_COMMUNITY): Payer: Self-pay

## 2016-06-10 ENCOUNTER — Inpatient Hospital Stay (HOSPITAL_COMMUNITY)
Admission: EM | Admit: 2016-06-10 | Discharge: 2016-06-13 | DRG: 683 | Disposition: A | Discharge status: Hospice | Payer: Medicare HMO | Attending: Internal Medicine | Admitting: Internal Medicine

## 2016-06-10 DIAGNOSIS — H547 Unspecified visual loss: Secondary | ICD-10-CM | POA: Diagnosis present

## 2016-06-10 DIAGNOSIS — E860 Dehydration: Secondary | ICD-10-CM | POA: Diagnosis present

## 2016-06-10 DIAGNOSIS — E872 Acidosis, unspecified: Secondary | ICD-10-CM

## 2016-06-10 DIAGNOSIS — E118 Type 2 diabetes mellitus with unspecified complications: Secondary | ICD-10-CM

## 2016-06-10 DIAGNOSIS — S72001A Fracture of unspecified part of neck of right femur, initial encounter for closed fracture: Secondary | ICD-10-CM | POA: Diagnosis present

## 2016-06-10 DIAGNOSIS — N179 Acute kidney failure, unspecified: Secondary | ICD-10-CM | POA: Diagnosis present

## 2016-06-10 DIAGNOSIS — Z681 Body mass index (BMI) 19 or less, adult: Secondary | ICD-10-CM | POA: Diagnosis not present

## 2016-06-10 DIAGNOSIS — E119 Type 2 diabetes mellitus without complications: Secondary | ICD-10-CM | POA: Diagnosis present

## 2016-06-10 DIAGNOSIS — Z79899 Other long term (current) drug therapy: Secondary | ICD-10-CM

## 2016-06-10 DIAGNOSIS — Z7189 Other specified counseling: Secondary | ICD-10-CM

## 2016-06-10 DIAGNOSIS — I959 Hypotension, unspecified: Secondary | ICD-10-CM | POA: Diagnosis present

## 2016-06-10 DIAGNOSIS — Z66 Do not resuscitate: Secondary | ICD-10-CM | POA: Diagnosis present

## 2016-06-10 DIAGNOSIS — Z515 Encounter for palliative care: Secondary | ICD-10-CM | POA: Diagnosis present

## 2016-06-10 DIAGNOSIS — R680 Hypothermia, not associated with low environmental temperature: Secondary | ICD-10-CM | POA: Diagnosis present

## 2016-06-10 DIAGNOSIS — R159 Full incontinence of feces: Secondary | ICD-10-CM | POA: Diagnosis present

## 2016-06-10 DIAGNOSIS — F0390 Unspecified dementia without behavioral disturbance: Secondary | ICD-10-CM

## 2016-06-10 DIAGNOSIS — Z7984 Long term (current) use of oral hypoglycemic drugs: Secondary | ICD-10-CM

## 2016-06-10 DIAGNOSIS — E039 Hypothyroidism, unspecified: Secondary | ICD-10-CM | POA: Diagnosis present

## 2016-06-10 DIAGNOSIS — Z7401 Bed confinement status: Secondary | ICD-10-CM

## 2016-06-10 DIAGNOSIS — H409 Unspecified glaucoma: Secondary | ICD-10-CM | POA: Diagnosis present

## 2016-06-10 DIAGNOSIS — R32 Unspecified urinary incontinence: Secondary | ICD-10-CM | POA: Diagnosis present

## 2016-06-10 DIAGNOSIS — R410 Disorientation, unspecified: Secondary | ICD-10-CM

## 2016-06-10 DIAGNOSIS — R197 Diarrhea, unspecified: Secondary | ICD-10-CM | POA: Diagnosis present

## 2016-06-10 DIAGNOSIS — R5383 Other fatigue: Secondary | ICD-10-CM | POA: Diagnosis not present

## 2016-06-10 DIAGNOSIS — F0280 Dementia in other diseases classified elsewhere without behavioral disturbance: Secondary | ICD-10-CM | POA: Diagnosis present

## 2016-06-10 DIAGNOSIS — I10 Essential (primary) hypertension: Secondary | ICD-10-CM | POA: Diagnosis present

## 2016-06-10 DIAGNOSIS — N170 Acute kidney failure with tubular necrosis: Secondary | ICD-10-CM | POA: Diagnosis not present

## 2016-06-10 DIAGNOSIS — R634 Abnormal weight loss: Secondary | ICD-10-CM | POA: Diagnosis present

## 2016-06-10 DIAGNOSIS — E875 Hyperkalemia: Secondary | ICD-10-CM | POA: Diagnosis present

## 2016-06-10 DIAGNOSIS — R627 Adult failure to thrive: Secondary | ICD-10-CM | POA: Diagnosis present

## 2016-06-10 DIAGNOSIS — T68XXXA Hypothermia, initial encounter: Secondary | ICD-10-CM | POA: Diagnosis not present

## 2016-06-10 DIAGNOSIS — R6251 Failure to thrive (child): Secondary | ICD-10-CM | POA: Diagnosis present

## 2016-06-10 HISTORY — DX: Essential (primary) hypertension: I10

## 2016-06-10 HISTORY — DX: Type 2 diabetes mellitus without complications: E11.9

## 2016-06-10 HISTORY — DX: Hyperlipidemia, unspecified: E78.5

## 2016-06-10 HISTORY — DX: Unspecified glaucoma: H40.9

## 2016-06-10 HISTORY — DX: Unspecified dementia, unspecified severity, without behavioral disturbance, psychotic disturbance, mood disturbance, and anxiety: F03.90

## 2016-06-10 HISTORY — DX: Hypothyroidism, unspecified: E03.9

## 2016-06-10 LAB — CBC WITH DIFFERENTIAL/PLATELET
BASOS ABS: 0 10*3/uL (ref 0.0–0.1)
BASOS PCT: 0 %
EOS ABS: 0 10*3/uL (ref 0.0–0.7)
EOS PCT: 0 %
HCT: 31.4 % — ABNORMAL LOW (ref 36.0–46.0)
Hemoglobin: 9.8 g/dL — ABNORMAL LOW (ref 12.0–15.0)
LYMPHS PCT: 9 %
Lymphs Abs: 1.3 10*3/uL (ref 0.7–4.0)
MCH: 29.5 pg (ref 26.0–34.0)
MCHC: 31.2 g/dL (ref 30.0–36.0)
MCV: 94.6 fL (ref 78.0–100.0)
Monocytes Absolute: 0.4 10*3/uL (ref 0.1–1.0)
Monocytes Relative: 3 %
NEUTROS PCT: 88 %
Neutro Abs: 12.2 10*3/uL — ABNORMAL HIGH (ref 1.7–7.7)
PLATELETS: 289 10*3/uL (ref 150–400)
RBC: 3.32 MIL/uL — AB (ref 3.87–5.11)
RDW: 15.9 % — AB (ref 11.5–15.5)
WBC: 13.9 10*3/uL — ABNORMAL HIGH (ref 4.0–10.5)

## 2016-06-10 LAB — I-STAT CHEM 8, ED
BUN: 84 mg/dL — ABNORMAL HIGH (ref 6–20)
Calcium, Ion: 1.2 mmol/L (ref 1.15–1.40)
Chloride: 107 mmol/L (ref 101–111)
Creatinine, Ser: 7.1 mg/dL — ABNORMAL HIGH (ref 0.44–1.00)
Glucose, Bld: 202 mg/dL — ABNORMAL HIGH (ref 65–99)
HCT: 31 % — ABNORMAL LOW (ref 36.0–46.0)
HEMOGLOBIN: 10.5 g/dL — AB (ref 12.0–15.0)
Potassium: 5.3 mmol/L — ABNORMAL HIGH (ref 3.5–5.1)
SODIUM: 135 mmol/L (ref 135–145)
TCO2: 10 mmol/L (ref 0–100)

## 2016-06-10 LAB — COMPREHENSIVE METABOLIC PANEL
ALT: 9 U/L — ABNORMAL LOW (ref 14–54)
ANION GAP: 26 — AB (ref 5–15)
AST: 27 U/L (ref 15–41)
Albumin: 3.8 g/dL (ref 3.5–5.0)
Alkaline Phosphatase: 60 U/L (ref 38–126)
BUN: 82 mg/dL — ABNORMAL HIGH (ref 6–20)
CHLORIDE: 101 mmol/L (ref 101–111)
CO2: 9 mmol/L — AB (ref 22–32)
Calcium: 9.7 mg/dL (ref 8.9–10.3)
Creatinine, Ser: 6.56 mg/dL — ABNORMAL HIGH (ref 0.44–1.00)
GFR calc non Af Amer: 5 mL/min — ABNORMAL LOW (ref >60)
GFR, EST AFRICAN AMERICAN: 6 mL/min — AB (ref >60)
Glucose, Bld: 211 mg/dL — ABNORMAL HIGH (ref 65–99)
Potassium: 5.3 mmol/L — ABNORMAL HIGH (ref 3.5–5.1)
SODIUM: 136 mmol/L (ref 135–145)
Total Bilirubin: 0.6 mg/dL (ref 0.3–1.2)
Total Protein: 8.2 g/dL — ABNORMAL HIGH (ref 6.5–8.1)

## 2016-06-10 LAB — I-STAT CG4 LACTIC ACID, ED
LACTIC ACID, VENOUS: 10.8 mmol/L — AB (ref 0.5–1.9)
Lactic Acid, Venous: 11.12 mmol/L (ref 0.5–1.9)

## 2016-06-10 LAB — GLUCOSE, CAPILLARY: Glucose-Capillary: 193 mg/dL — ABNORMAL HIGH (ref 65–99)

## 2016-06-10 LAB — TSH: TSH: 7.738 u[IU]/mL — ABNORMAL HIGH (ref 0.350–4.500)

## 2016-06-10 LAB — CORTISOL: Cortisol, Plasma: 53.5 ug/dL

## 2016-06-10 LAB — MRSA PCR SCREENING: MRSA by PCR: NEGATIVE

## 2016-06-10 LAB — CBG MONITORING, ED: GLUCOSE-CAPILLARY: 207 mg/dL — AB (ref 65–99)

## 2016-06-10 MED ORDER — SODIUM CHLORIDE 0.9 % IV BOLUS (SEPSIS)
1000.0000 mL | Freq: Once | INTRAVENOUS | Status: AC
  Administered 2016-06-10: 1000 mL via INTRAVENOUS

## 2016-06-10 MED ORDER — CIPROFLOXACIN IN D5W 200 MG/100ML IV SOLN
200.0000 mg | INTRAVENOUS | Status: DC
  Administered 2016-06-10: 200 mg via INTRAVENOUS
  Filled 2016-06-10: qty 100

## 2016-06-10 MED ORDER — HEPARIN SODIUM (PORCINE) 5000 UNIT/ML IJ SOLN
5000.0000 [IU] | Freq: Three times a day (TID) | INTRAMUSCULAR | Status: DC
  Administered 2016-06-10 – 2016-06-13 (×7): 5000 [IU] via SUBCUTANEOUS
  Filled 2016-06-10 (×7): qty 1

## 2016-06-10 MED ORDER — ACETAMINOPHEN 325 MG PO TABS: 650.0000 mg | ORAL_TABLET | Freq: Four times a day (QID) | ORAL | Status: DC | PRN

## 2016-06-10 MED ORDER — METRONIDAZOLE IN NACL 5-0.79 MG/ML-% IV SOLN
500.0000 mg | Freq: Three times a day (TID) | INTRAVENOUS | Status: DC
  Administered 2016-06-10 – 2016-06-11 (×2): 500 mg via INTRAVENOUS
  Filled 2016-06-10 (×2): qty 100

## 2016-06-10 MED ORDER — ONDANSETRON HCL 4 MG PO TABS: 4.0000 mg | ORAL_TABLET | Freq: Four times a day (QID) | ORAL | Status: DC | PRN

## 2016-06-10 MED ORDER — ACETAMINOPHEN 650 MG RE SUPP: 650.0000 mg | Freq: Four times a day (QID) | RECTAL | Status: DC | PRN

## 2016-06-10 MED ORDER — SODIUM CHLORIDE 0.9% FLUSH
3.0000 mL | Freq: Two times a day (BID) | INTRAVENOUS | Status: DC
  Administered 2016-06-10 – 2016-06-13 (×2): 3 mL via INTRAVENOUS

## 2016-06-10 MED ORDER — ONDANSETRON HCL 4 MG/2ML IJ SOLN: 4.0000 mg | Freq: Four times a day (QID) | INTRAMUSCULAR | Status: DC | PRN

## 2016-06-10 MED ORDER — ALBUTEROL SULFATE (2.5 MG/3ML) 0.083% IN NEBU: 2.5000 mg | INHALATION_SOLUTION | RESPIRATORY_TRACT | Status: DC | PRN

## 2016-06-10 MED ORDER — LEVOTHYROXINE SODIUM 25 MCG PO TABS
25.0000 ug | ORAL_TABLET | Freq: Every day | ORAL | Status: DC
  Administered 2016-06-11 – 2016-06-13 (×3): 25 ug via ORAL
  Filled 2016-06-10 (×3): qty 1

## 2016-06-10 MED ORDER — INSULIN ASPART 100 UNIT/ML ~~LOC~~ SOLN
0.0000 [IU] | Freq: Three times a day (TID) | SUBCUTANEOUS | Status: DC
  Administered 2016-06-11 (×3): 2 [IU] via SUBCUTANEOUS
  Administered 2016-06-12: 1 [IU] via SUBCUTANEOUS
  Administered 2016-06-12: 2 [IU] via SUBCUTANEOUS
  Administered 2016-06-13: 3 [IU] via SUBCUTANEOUS
  Administered 2016-06-13: 2 [IU] via SUBCUTANEOUS

## 2016-06-10 MED ORDER — DEXTROSE 5 % IV SOLN
INTRAVENOUS | Status: DC
  Filled 2016-06-10 (×2): qty 1000

## 2016-06-10 MED ORDER — PIPERACILLIN-TAZOBACTAM 3.375 G IVPB 30 MIN
3.3750 g | Freq: Once | INTRAVENOUS | Status: AC
  Administered 2016-06-10: 3.375 g via INTRAVENOUS
  Filled 2016-06-10: qty 50

## 2016-06-10 MED ORDER — SODIUM BICARBONATE 8.4 % IV SOLN
INTRAVENOUS | Status: DC
  Administered 2016-06-10 – 2016-06-13 (×7): via INTRAVENOUS
  Filled 2016-06-10 (×5): qty 150
  Filled 2016-06-10: qty 50
  Filled 2016-06-10 (×2): qty 150

## 2016-06-10 NOTE — H&P (Signed)
HISTORY AND PHYSICAL       PATIENT DETAILS Name: [name] Age: 83 y.o. Sex: female Date of Birth: 05/12/1932 Admit Date: 06/10/2016 [id number], Maisie Fus, MD   Patient coming from: Home   CHIEF COMPLAINT:  Weakness, fatigue and lethargy since yesterday Diarrhea-3 days  HPI: [name] is a 83 y.o. female with medical history significant of advanced dementia-mostly bed bound-dependent on her daughter for all daily activities of living brought to the emergency room for evaluation of worsening lethargy and confusion along with diarrhea. Please note, patient is not able to participate in the history taking process due to advanced dementia, most of this history is obtained from the daughter. As noted above, the patient is dependent on her daughter for all her daily activities of living-she is essentially bedbound-requires assistance to even be put into a chair at bedside. Approximately 3 days back, patient developed profuse loose watery stools-approximately 8 times in a 24-hour period. She also had some nausea and apparently 2 episodes of vomiting so far. Her appetite markedly decreased over the past few days. This morning, she was noted to be very lethargic and hard to arouse, as a result the patient was brought to the emergency room for further evaluation and treatment.  The daughter has not noted fever, cough. Per daughter, patient has not complained of any chest pain or abdominal pain. No sick contacts, patient's husband also has advanced dementia but he does not have diarrhea/GI symptoms.  Per patient's daughter, patient has been gradually losing weight for the past few months-and she thinks patient has lost a significant amount of weight in the past few days.  At baseline-patient is incontinent of both urine and stool.  ED Course:  In the emergency room, patient was found to have a creatinine of 6.56 and a bicarbonate of 9. She was profoundly  hypothermic with a rectal temp of 92.42F. Patient was given 2 L of normal saline, placed on a bear hugger, placed on 100% nonrebreather mask and the hospitalist service was subsequently asked to admit this patient for further evaluation and treatment.  Note: Lives at: Home Mobility: Bedbound Chronic Indwelling Foley:no   REVIEW OF SYSTEMS: Obtained after speaking with the patient's daughter Constitutional:   No   night sweats,  Fevers.  HEENT:    No headaches, Dysphagia,Tooth/dental problems,Sore throat   Cardio-vascular: No chest pain, no palpitations  GI:  No heartburn, indigestion,  melena or hematochezia  Resp: No shortness of breath, cough, hemoptysis,plueritic chest pain.   Skin:  No rash or lesions.  GU:  No hematuria, no foul-smelling urine  Musculoskeletal: No joint pain or swelling.    Endocrine: No o polyuria, no polydipsia  Psych: More slow/weaker and lethargic than usual   ALLERGIES:  No Known Allergies  PAST MEDICAL HISTORY: Past Medical History:  Diagnosis Date  . Dementia   . Diabetes mellitus without complication (HCC)   . Glaucoma   . Hyperlipidemia   . Hypertension   . Hypothyroidism     PAST SURGICAL HISTORY: Past Surgical History:  Procedure Laterality Date  . ABDOMINAL HYSTERECTOMY    . BREAST REDUCTION SURGERY    . CATARACT EXTRACTION, BILATERAL      MEDICATIONS AT HOME: Prior to Admission medications   Medication Sig Start Date End Date Taking? Authorizing Provider  benazepril-hydrochlorthiazide (LOTENSIN HCT) 20-12.5 MG per tablet Take 1 tablet by mouth daily.   Yes [provider]  Enalapril-Hydrochlorothiazide 5-12.5 MG tablet Take  1 tablet by mouth daily. 03/09/16  Yes [provider]  fish oil-omega-3 fatty acids 1000 MG capsule Take 2 g by mouth daily.   Yes [provider]  levothyroxine (SYNTHROID, LEVOTHROID) 25 MCG tablet Take 25 mcg by mouth daily before breakfast.    Yes [provider]  metFORMIN (GLUCOPHAGE) 1000 MG tablet Take 1,000 mg by mouth 2 (two) times daily with a meal.   Yes [provider]  simvastatin (ZOCOR) 40 MG tablet Take 40 mg by mouth every evening.   Yes [provider]    FAMILY HISTORY: No family history of dementia or renal failure  SOCIAL HISTORY:  reports that she has never smoked. She has never used smokeless tobacco. She reports that she does not use drugs. Her alcohol history is not on file.  PHYSICAL EXAM: Blood pressure 123/60, pulse 85, temperature (!) 92.3 F (33.5 C), temperature source Rectal, resp. rate 15, height 5\' 5"  (1.651 m), weight 49.9 kg (110 lb), SpO2 100 %.  General appearance: Acutely sick looking-appears very frail-lethargic-not following commands. Occasionally opens eyes and mumbles incoherently. However 100% nonrebreather mask.  Eyes: pupils equally reactive to light  HEENT: Atraumatic and Normocephalic Neck: supple, no JVD.  Resp:Good air entry bilaterally CVS: S1 S2 regular GI: Bowel sounds present, Non tender, abdomen appears to be scaphoid Extremities: B/L Lower Ext shows no edema, both legs are warm to touch Neurology: Profoundly weak-very hard to examine further-but appears to be nonfocal.  Psychiatric: Very confused and lethargic. Musculoskeletal:.No digital cyanosis Skin:No Rash, warm and dry Wounds:N/A  LABS ON ADMISSION:  I have personally reviewed following labs and imaging studies  CBC:  Recent Labs Lab 06/10/16 0952 06/10/16 1012  WBC 13.9*  --   NEUTROABS 12.2*  --   HGB 9.8* 10.5*  HCT 31.4* 31.0*  MCV 94.6  --   PLT 289  --     Basic Metabolic Panel:  Recent Labs Lab 06/10/16 0952 06/10/16 1012  NA 136 135  K 5.3* 5.3*  CL 101 107  CO2 9*  --   GLUCOSE 211* 202*  BUN 82* 84*  CREATININE 6.56* 7.10*  CALCIUM 9.7  --     GFR: Estimated Creatinine Clearance: 4.9 mL/min (A) (by C-G formula based on SCr of 7.1 mg/dL (H)).  Liver Function  Tests:  Recent Labs Lab 06/10/16 0952  AST 27  ALT 9*  ALKPHOS 60  BILITOT 0.6  PROT 8.2*  ALBUMIN 3.8   No results for input(s): LIPASE, AMYLASE in the last 168 hours. No results for input(s): AMMONIA in the last 168 hours.  Coagulation Profile: No results for input(s): INR, PROTIME in the last 168 hours.  Cardiac Enzymes: No results for input(s): CKTOTAL, CKMB, CKMBINDEX, TROPONINI in the last 168 hours.  BNP (last 3 results) No results for input(s): PROBNP in the last 8760 hours.  HbA1C: No results for input(s): HGBA1C in the last 72 hours.  CBG:  Recent Labs Lab 06/10/16 0954  GLUCAP 207*    Lipid Profile: No results for input(s): CHOL, HDL, LDLCALC, TRIG, CHOLHDL, LDLDIRECT in the last 72 hours.  Thyroid Function Tests: No results for input(s): TSH, T4TOTAL, FREET4, T3FREE, THYROIDAB in the last 72 hours.  Anemia Panel: No results for input(s): VITAMINB12, FOLATE, FERRITIN, TIBC, IRON, RETICCTPCT in the last 72 hours.  Urine analysis:    Component Value Date/Time   COLORURINE YELLOW 09/07/2007 1200   APPEARANCEUR CLEAR 09/07/2007 1200   LABSPEC 1.022 09/07/2007 1200  PHURINE 7.0 09/07/2007 1200   GLUCOSEU NEGATIVE 09/07/2007 1200   HGBUR NEGATIVE 09/07/2007 1200   BILIRUBINUR NEGATIVE 09/07/2007 1200   KETONESUR NEGATIVE 09/07/2007 1200   PROTEINUR NEGATIVE 09/07/2007 1200   UROBILINOGEN 1.0 09/07/2007 1200   NITRITE NEGATIVE 09/07/2007 1200   LEUKOCYTESUR TRACE (A) 09/07/2007 1200    Sepsis Labs: Lactic Acid, Venous    Component Value Date/Time   LATICACIDVEN 11.12 (HH) 06/10/2016 1005     Microbiology: No results found for this or any previous visit (from the past 240 hour(s)).    RADIOLOGIC STUDIES ON ADMISSION: Ct Abdomen Pelvis Wo Contrast  Result Date: 06/10/2016 CLINICAL DATA:  Nausea and vomiting for 3 days EXAM: CT CHEST, ABDOMEN AND PELVIS WITHOUT CONTRAST TECHNIQUE: Multidetector CT imaging of the chest, abdomen and pelvis  was performed following the standard protocol without IV contrast. COMPARISON:  None. FINDINGS: CT CHEST FINDINGS Cardiovascular: Diffuse aortic calcifications are noted. No aneurysmal dilatation is seen. Coronary calcifications are noted. No cardiac enlargement is seen. Mediastinum/Nodes: Thoracic inlet demonstrates some heterogeneity of the right thyroid gland. No discrete nodule is seen. No hilar or mediastinal adenopathy is noted. Lungs/Pleura: Lungs are well aerated bilaterally. Mild septal thickening is noted likely of a chronic nature. Mild right basilar infiltrative change is seen posteriorly. No sizable effusion is noted. Musculoskeletal: Degenerative changes of thoracic spine are noted. No acute compression deformity is seen. No definitive rib abnormality is noted. CT ABDOMEN PELVIS FINDINGS Hepatobiliary: No focal liver abnormality is seen. No gallstones, gallbladder wall thickening, or biliary dilatation. Pancreas: Unremarkable. No pancreatic ductal dilatation or surrounding inflammatory changes. Spleen: Normal in size without focal abnormality. Adrenals/Urinary Tract: Adrenal glands are within normal limits. Nonobstructing right renal stones are seen. A dominant right renal cyst is noted inferiorly measuring approximately 2.8 cm. A hyperdense 10 mm lesion is noted in the left kidney likely representing hyperdense cyst. Bladder is well distended. Stomach/Bowel: No obstructive changes are seen. No inflammatory changes are seen. The appendix is not well appreciated. Stomach is well distended. A small sliding-type hiatal hernia is noted. Vascular/Lymphatic: Aortic atherosclerosis. No enlarged abdominal or pelvic lymph nodes. Reproductive: The uterus has been removed. Foley catheter is seen this placed within the vaginal cuff. Clinical correlation with urine output is recommended. Other: No abdominal wall hernia or abnormality. No abdominopelvic ascites. Musculoskeletal: Degenerative changes of the lumbar  spine are noted. No compression deformity is seen. IMPRESSION: Foley catheter appears to be placed within the vaginal cuff. Correlation with urine output is recommended. The bladder remains well distended. Nonobstructing right renal stones. Bilateral renal cysts. No other acute abnormality is noted. Electronically Signed   By: Alcide Clever M.D.   On: 06/10/2016 11:26   Ct Head Wo Contrast  Result Date: 06/10/2016 CLINICAL DATA:  Altered mental status. Nausea, vomiting, and diarrhea for 3 days. History of Alzheimer's. EXAM: CT HEAD WITHOUT CONTRAST TECHNIQUE: Contiguous axial images were obtained from the base of the skull through the vertex without intravenous contrast. COMPARISON:  Brain MRI 05/17/2011 FINDINGS: Brain: Lateral and third ventriculomegaly is unchanged and felt to reflect moderate cerebral atrophy rather than hydrocephalus. Periventricular white matter hypodensities are nonspecific but compatible with mild-to-moderate chronic small vessel ischemic disease. A chronic lacunar infarct is again noted in the right thalamus. There is no evidence of acute infarct, intracranial hemorrhage, mass, midline shift, or extra-axial fluid collection. Vascular: Calcified atherosclerosis at the skullbase. No hyperdense vessel. Skull: No fracture or focal osseous lesion. Sinuses/Orbits: Chronic left sphenoid sinusitis. Postoperative changes to both  globes. Clear mastoid air cells. Other: None. IMPRESSION: 1. No evidence of acute intracranial abnormality. 2. Mild-to-moderate chronic small vessel ischemic disease and moderate cerebral atrophy. Electronically Signed   By: Sebastian AcheAllen  Grady M.D.   On: 06/10/2016 11:18   Ct Chest Wo Contrast  Result Date: 06/10/2016 CLINICAL DATA:  Nausea and vomiting for 3 days EXAM: CT CHEST, ABDOMEN AND PELVIS WITHOUT CONTRAST TECHNIQUE: Multidetector CT imaging of the chest, abdomen and pelvis was performed following the standard protocol without IV contrast. COMPARISON:  None.  FINDINGS: CT CHEST FINDINGS Cardiovascular: Diffuse aortic calcifications are noted. No aneurysmal dilatation is seen. Coronary calcifications are noted. No cardiac enlargement is seen. Mediastinum/Nodes: Thoracic inlet demonstrates some heterogeneity of the right thyroid gland. No discrete nodule is seen. No hilar or mediastinal adenopathy is noted. Lungs/Pleura: Lungs are well aerated bilaterally. Mild septal thickening is noted likely of a chronic nature. Mild right basilar infiltrative change is seen posteriorly. No sizable effusion is noted. Musculoskeletal: Degenerative changes of thoracic spine are noted. No acute compression deformity is seen. No definitive rib abnormality is noted. CT ABDOMEN PELVIS FINDINGS Hepatobiliary: No focal liver abnormality is seen. No gallstones, gallbladder wall thickening, or biliary dilatation. Pancreas: Unremarkable. No pancreatic ductal dilatation or surrounding inflammatory changes. Spleen: Normal in size without focal abnormality. Adrenals/Urinary Tract: Adrenal glands are within normal limits. Nonobstructing right renal stones are seen. A dominant right renal cyst is noted inferiorly measuring approximately 2.8 cm. A hyperdense 10 mm lesion is noted in the left kidney likely representing hyperdense cyst. Bladder is well distended. Stomach/Bowel: No obstructive changes are seen. No inflammatory changes are seen. The appendix is not well appreciated. Stomach is well distended. A small sliding-type hiatal hernia is noted. Vascular/Lymphatic: Aortic atherosclerosis. No enlarged abdominal or pelvic lymph nodes. Reproductive: The uterus has been removed. Foley catheter is seen this placed within the vaginal cuff. Clinical correlation with urine output is recommended. Other: No abdominal wall hernia or abnormality. No abdominopelvic ascites. Musculoskeletal: Degenerative changes of the lumbar spine are noted. No compression deformity is seen. IMPRESSION: Foley catheter appears to  be placed within the vaginal cuff. Correlation with urine output is recommended. The bladder remains well distended. Nonobstructing right renal stones. Bilateral renal cysts. No other acute abnormality is noted. Electronically Signed   By: Alcide CleverMark  Lukens M.D.   On: 06/10/2016 11:26   Dg Chest Port 1 View  Result Date: 06/10/2016 CLINICAL DATA:  Altered mental status. EXAM: PORTABLE CHEST 1 VIEW COMPARISON:  10/11/2007 FINDINGS: Heart size is normal. There is aortic atherosclerosis. The lungs are clear. The vascularity is normal. No effusions. No significant bone finding. IMPRESSION: No active disease.  Aortic atherosclerosis. Electronically Signed   By: Paulina FusiMark  Shogry M.D.   On: 06/10/2016 10:05    I have personally reviewed images of chest xray or the CT chest/abdomen  EKG:  Personally reviewed. Normal sinus rhythm   ASSESSMENT AND PLAN: Acute renal failure with anion gap metabolic acidosis and mild hyperkalemia: Suspect has acute tubular necrosis primarily due to dehydration and hemodynamic mediated kidney injury. Probably enalapril/HCTZ and metformin all contribution to renal injury and metabolic acidosis. Given advanced dementia-frailty-she is probably not a good candidate for renal replacement/hemodialysis therapy. I have spoken with Dr. Arta SilenceShertz (renal M.D.) over the phone, was reviewed the chart and agrees. I have also is explained this in great detail to the patient's daughter at bedside, she is the primary caregiver for this patient. She appears very dry on my exam. She has received numerous  boluses of normal saline in the emergency room, I will transition her to IV fluids with bicarbonate, hold nephrotoxic agents-and provide other supportive measures. If she does not improve with these measures-family is aware that we will probably need to transition to comfort/hospice care. After extensive discussion with daughter at bedside, patient is a DNR  Diarrhea: Per daughter-no antibiotic exposure in the  past few months. No sick contacts-no family members with similar illnesses. Given hypothermia in profound renal failure-we will empirically cover with Cipro/Flagyl pending stool studies (GI pathogen panel).  Hypothermia: Likely due to severe dehydration and metabolic derangements. Check TSH and cortisol level. Continue Lawyer.  DM-2: Hold metformin-follow CBGs on SSI.  Hypertension: Appears profoundly dry on my exam and blood pressure appears to be soft-hold antihypertensives-hydrate and follow.  Dementia with failure to thrive syndrome: As noted above patient is TOTALLY dependent on her daughter for all her daily activities of living. She is not able to sit up on home, she requires assistance even to take 1-2 steps to get to a bedside chair. She has lost significant amount of weight-her abdomen appears to be scaphoid. Patient clearly is not a candidate for aggressive care at this stage of her life-spoken with the family/daughter-she is agreeable. Will consult palliative care.  Goals of care: As noted above-poor overall prognoses-has advanced dementia-dependent on family for all daily activities of living. After discussion with daughter-DNR, I do not think patient is an appropriate candidate for hemodialysis. We will attempt supportive care with IV fluids, empiric antimicrobial therapy-however if the patient does not improve with these measures, family is aware that we will need to transition to hospice/comfort measures. Will consult palliative care.  Further plan will depend as patient's clinical course evolves and further radiologic and laboratory data become available. Patient will be monitored closely.  Above noted plan was discussed with patient/daughter face to face at bedside, they were in agreement.   CONSULTS: None   DVT Prophylaxis: Prophylactic Heparin  Code Status: DNR  Disposition Plan: Disposition uncertain-depends on patient's clinical course.  Admission status:  Inpatient going to SDU  The medical decision making on this patient was of high complexity and the patient is at high risk for clinical deterioration, therefore this is a level 3 visit  Total time spent  55 minutes.Greater than 50% of this time was spent in counseling, explanation of diagnosis, planning of further management, and coordination of care.  Jeoffrey Massed [facility] Pager [phone]  If 7PM-7AM, please contact night-coverage [email or web address] Password [email or web address] 06/10/2016, 1:05 PM

## 2016-06-10 NOTE — ED Notes (Signed)
Upon changing patient's adult brief, foley was noted to be dislodged with balloon still inflated. After cleaning patient, new catheter was inserted and urine return noted. ED provider did an ultrasound at bedside to confirm balloon and catheter placement.

## 2016-06-10 NOTE — ED Notes (Signed)
Behr hugger reapplied and temperature at highest setting. MD made aware of rectal temperature.

## 2016-06-10 NOTE — ED Notes (Signed)
Bladder scanner showed approx 70ml urine in bladder.

## 2016-06-10 NOTE — ED Triage Notes (Signed)
Per EMS report:  Pt from home lives w/family, has been n/v/d x 3 days, not eating/drinking well.  Daughter reported pt has been more lethargic.  EMS found RA sats to be 80's, BP 98/56, 102, CBG 67.  Placed on NRB, given of D10%.  EMS states pt was answering some questions.  HX of alzheimers and per daughter, pt will answer her name.

## 2016-06-10 NOTE — ED Provider Notes (Signed)
[facility]-EMERGENCY DEPT Provider Note   CSN: [id number] Arrival date & time: 06/10/16  4098     History   Chief Complaint Chief Complaint  Patient presents with  . Altered Mental Status    HPI [name] is a 83 y.o. female hx of dementia, DM, HL, HTN, here with AMS, vomiting, diarrhea. Patient is demented and lives at home with her daughter. At baseline, patient can eat and does not walk very much and requires assistance for ambulation and daily chores. The last 3 days, patient was noted have numerous episodes of diarrhea as well as poor appetite. She had 3-4 episodes of vomiting daily. Also has been progressively more altered and confused. Daughter called EMS today for worsening confusion and dehydration. EMS noted that patient was cold to touch, hypotensive 98/56, CBG 67, ? O2 80%. Patient put on nonrebreather, given 250 ml D10 and CBG was 94 on arrival. Patient unable to give history.   The history is provided by the EMS personnel and a relative. The history is limited by the condition of the patient.   Level V caveat- AMS, dementia   Past Medical History:  Diagnosis Date  . Dementia   . Diabetes mellitus without complication (HCC)   . Glaucoma   . Hyperlipidemia   . Hypertension   . Hypothyroidism     There are no active problems to display for this patient.   Past Surgical History:  Procedure Laterality Date  . ABDOMINAL HYSTERECTOMY    . BREAST REDUCTION SURGERY    . CATARACT EXTRACTION, BILATERAL      OB History    No data available       Home Medications    Prior to Admission medications   Medication Sig Start Date End Date Taking? Authorizing Provider  benazepril-hydrochlorthiazide (LOTENSIN HCT) 20-12.5 MG per tablet Take 1 tablet by mouth daily.   Yes [provider]  Enalapril-Hydrochlorothiazide 5-12.5 MG tablet Take 1 tablet by mouth daily. 03/09/16  Yes [provider]  levothyroxine (SYNTHROID, LEVOTHROID) 25 MCG tablet  Take 25 mcg by mouth daily before breakfast.    Yes [provider]  metFORMIN (GLUCOPHAGE) 1000 MG tablet Take 1,000 mg by mouth 2 (two) times daily with a meal.   Yes [provider]  simvastatin (ZOCOR) 40 MG tablet Take 40 mg by mouth every evening.   Yes [provider]  brimonidine (ALPHAGAN P) 0.1 % SOLN Place 1 drop into both eyes 2 (two) times daily.     [provider]  fish oil-omega-3 fatty acids 1000 MG capsule Take 2 g by mouth daily.    [provider]    Family History History reviewed. No pertinent family history.  Social History Social History  Substance Use Topics  . Smoking status: Never Smoker  . Smokeless tobacco: Never Used  . Alcohol use Not on file     Allergies   Patient has no known allergies.   Review of Systems Review of Systems  Gastrointestinal: Positive for diarrhea and vomiting.  Neurological: Positive for weakness.  Psychiatric/Behavioral: Positive for confusion.  All other systems reviewed and are negative.    Physical Exam Updated Vital Signs BP (!) 125/55   Pulse 87   Temp (!) 92.3 F (33.5 C) (Rectal)   Resp 17   Ht 5\' 5"  (1.651 m)   Wt 110 lb (49.9 kg)   SpO2 100%   BMI 18.30 kg/m   Physical Exam  Constitutional:  Dehydrated, cold, altered  HENT:  Head: Normocephalic.  MM dry   Eyes: EOM are normal. Pupils are equal, round, and reactive to light.  Neck: Normal range of motion. Neck supple.  Cardiovascular: Normal rate, regular rhythm and normal heart sounds.   Pulmonary/Chest:  Diminished bilateral bases   Abdominal:  Soft, not distended. + Bowel sounds   Musculoskeletal: Normal range of motion.  Neurological:  Confused, moving all extremities. Not following commands   Skin:  Stage 1 sacral decub   Psychiatric:  Unable   Nursing note and vitals reviewed.    ED Treatments / Results  Labs (all labs ordered are listed, but only abnormal results are displayed) Labs  Reviewed  COMPREHENSIVE METABOLIC PANEL - Abnormal; Notable for the following:       Result Value   Potassium 5.3 (*)    CO2 9 (*)    Glucose, Bld 211 (*)    BUN 82 (*)    Creatinine, Ser 6.56 (*)    Total Protein 8.2 (*)    ALT 9 (*)    GFR calc non Af Amer 5 (*)    GFR calc Af Amer 6 (*)    Anion gap 26 (*)    All other components within normal limits  CBC WITH DIFFERENTIAL/PLATELET - Abnormal; Notable for the following:    WBC 13.9 (*)    RBC 3.32 (*)    Hemoglobin 9.8 (*)    HCT 31.4 (*)    RDW 15.9 (*)    Neutro Abs 12.2 (*)    All other components within normal limits  I-STAT CG4 LACTIC ACID, ED - Abnormal; Notable for the following:    Lactic Acid, Venous 11.12 (*)    All other components within normal limits  CBG MONITORING, ED - Abnormal; Notable for the following:    Glucose-Capillary 207 (*)    All other components within normal limits  I-STAT CHEM 8, ED - Abnormal; Notable for the following:    Potassium 5.3 (*)    BUN 84 (*)    Creatinine, Ser 7.10 (*)    Glucose, Bld 202 (*)    Hemoglobin 10.5 (*)    HCT 31.0 (*)    All other components within normal limits  CULTURE, BLOOD (ROUTINE X 2)  CULTURE, BLOOD (ROUTINE X 2)  URINE CULTURE  URINALYSIS, ROUTINE W REFLEX MICROSCOPIC  URINALYSIS, COMPLETE (UACMP) WITH MICROSCOPIC  TSH  I-STAT CG4 LACTIC ACID, ED    EKG  EKG Interpretation  Date/Time:  Thursday Jun 10 2016 09:58:09 EDT Ventricular Rate:  94 PR Interval:    QRS Duration: 90 QT Interval:  385 QTC Calculation: 482 R Axis:   103 Text Interpretation:  Sinus rhythm Prolonged PR interval Consider left atrial enlargement Right axis deviation Borderline low voltage, extremity leads No significant change since last tracing Confirmed by YAO  MD, DAVID ([id number]) on 06/10/2016 10:01:19 AM       Radiology Ct Abdomen Pelvis Wo Contrast  Result Date: 06/10/2016 CLINICAL DATA:  Nausea and vomiting for 3 days EXAM: CT CHEST, ABDOMEN AND PELVIS WITHOUT  CONTRAST TECHNIQUE: Multidetector CT imaging of the chest, abdomen and pelvis was performed following the standard protocol without IV contrast. COMPARISON:  None. FINDINGS: CT CHEST FINDINGS Cardiovascular: Diffuse aortic calcifications are noted. No aneurysmal dilatation is seen. Coronary calcifications are noted. No cardiac enlargement is seen. Mediastinum/Nodes: Thoracic inlet demonstrates some heterogeneity of the right thyroid gland. No discrete nodule is seen. No hilar or mediastinal adenopathy is noted. Lungs/Pleura: Lungs are well  aerated bilaterally. Mild septal thickening is noted likely of a chronic nature. Mild right basilar infiltrative change is seen posteriorly. No sizable effusion is noted. Musculoskeletal: Degenerative changes of thoracic spine are noted. No acute compression deformity is seen. No definitive rib abnormality is noted. CT ABDOMEN PELVIS FINDINGS Hepatobiliary: No focal liver abnormality is seen. No gallstones, gallbladder wall thickening, or biliary dilatation. Pancreas: Unremarkable. No pancreatic ductal dilatation or surrounding inflammatory changes. Spleen: Normal in size without focal abnormality. Adrenals/Urinary Tract: Adrenal glands are within normal limits. Nonobstructing right renal stones are seen. A dominant right renal cyst is noted inferiorly measuring approximately 2.8 cm. A hyperdense 10 mm lesion is noted in the left kidney likely representing hyperdense cyst. Bladder is well distended. Stomach/Bowel: No obstructive changes are seen. No inflammatory changes are seen. The appendix is not well appreciated. Stomach is well distended. A small sliding-type hiatal hernia is noted. Vascular/Lymphatic: Aortic atherosclerosis. No enlarged abdominal or pelvic lymph nodes. Reproductive: The uterus has been removed. Foley catheter is seen this placed within the vaginal cuff. Clinical correlation with urine output is recommended. Other: No abdominal wall hernia or abnormality. No  abdominopelvic ascites. Musculoskeletal: Degenerative changes of the lumbar spine are noted. No compression deformity is seen. IMPRESSION: Foley catheter appears to be placed within the vaginal cuff. Correlation with urine output is recommended. The bladder remains well distended. Nonobstructing right renal stones. Bilateral renal cysts. No other acute abnormality is noted. Electronically Signed   By: Alcide Clever M.D.   On: 06/10/2016 11:26   Ct Head Wo Contrast  Result Date: 06/10/2016 CLINICAL DATA:  Altered mental status. Nausea, vomiting, and diarrhea for 3 days. History of Alzheimer's. EXAM: CT HEAD WITHOUT CONTRAST TECHNIQUE: Contiguous axial images were obtained from the base of the skull through the vertex without intravenous contrast. COMPARISON:  Brain MRI 05/17/2011 FINDINGS: Brain: Lateral and third ventriculomegaly is unchanged and felt to reflect moderate cerebral atrophy rather than hydrocephalus. Periventricular white matter hypodensities are nonspecific but compatible with mild-to-moderate chronic small vessel ischemic disease. A chronic lacunar infarct is again noted in the right thalamus. There is no evidence of acute infarct, intracranial hemorrhage, mass, midline shift, or extra-axial fluid collection. Vascular: Calcified atherosclerosis at the skullbase. No hyperdense vessel. Skull: No fracture or focal osseous lesion. Sinuses/Orbits: Chronic left sphenoid sinusitis. Postoperative changes to both globes. Clear mastoid air cells. Other: None. IMPRESSION: 1. No evidence of acute intracranial abnormality. 2. Mild-to-moderate chronic small vessel ischemic disease and moderate cerebral atrophy. Electronically Signed   By: Sebastian Ache M.D.   On: 06/10/2016 11:18   Ct Chest Wo Contrast  Result Date: 06/10/2016 CLINICAL DATA:  Nausea and vomiting for 3 days EXAM: CT CHEST, ABDOMEN AND PELVIS WITHOUT CONTRAST TECHNIQUE: Multidetector CT imaging of the chest, abdomen and pelvis was performed  following the standard protocol without IV contrast. COMPARISON:  None. FINDINGS: CT CHEST FINDINGS Cardiovascular: Diffuse aortic calcifications are noted. No aneurysmal dilatation is seen. Coronary calcifications are noted. No cardiac enlargement is seen. Mediastinum/Nodes: Thoracic inlet demonstrates some heterogeneity of the right thyroid gland. No discrete nodule is seen. No hilar or mediastinal adenopathy is noted. Lungs/Pleura: Lungs are well aerated bilaterally. Mild septal thickening is noted likely of a chronic nature. Mild right basilar infiltrative change is seen posteriorly. No sizable effusion is noted. Musculoskeletal: Degenerative changes of thoracic spine are noted. No acute compression deformity is seen. No definitive rib abnormality is noted. CT ABDOMEN PELVIS FINDINGS Hepatobiliary: No focal liver abnormality is seen. No gallstones, gallbladder  wall thickening, or biliary dilatation. Pancreas: Unremarkable. No pancreatic ductal dilatation or surrounding inflammatory changes. Spleen: Normal in size without focal abnormality. Adrenals/Urinary Tract: Adrenal glands are within normal limits. Nonobstructing right renal stones are seen. A dominant right renal cyst is noted inferiorly measuring approximately 2.8 cm. A hyperdense 10 mm lesion is noted in the left kidney likely representing hyperdense cyst. Bladder is well distended. Stomach/Bowel: No obstructive changes are seen. No inflammatory changes are seen. The appendix is not well appreciated. Stomach is well distended. A small sliding-type hiatal hernia is noted. Vascular/Lymphatic: Aortic atherosclerosis. No enlarged abdominal or pelvic lymph nodes. Reproductive: The uterus has been removed. Foley catheter is seen this placed within the vaginal cuff. Clinical correlation with urine output is recommended. Other: No abdominal wall hernia or abnormality. No abdominopelvic ascites. Musculoskeletal: Degenerative changes of the lumbar spine are noted.  No compression deformity is seen. IMPRESSION: Foley catheter appears to be placed within the vaginal cuff. Correlation with urine output is recommended. The bladder remains well distended. Nonobstructing right renal stones. Bilateral renal cysts. No other acute abnormality is noted. Electronically Signed   By: Alcide Clever M.D.   On: 06/10/2016 11:26   Dg Chest Port 1 View  Result Date: 06/10/2016 CLINICAL DATA:  Altered mental status. EXAM: PORTABLE CHEST 1 VIEW COMPARISON:  10/11/2007 FINDINGS: Heart size is normal. There is aortic atherosclerosis. The lungs are clear. The vascularity is normal. No effusions. No significant bone finding. IMPRESSION: No active disease.  Aortic atherosclerosis. Electronically Signed   By: Paulina Fusi M.D.   On: 06/10/2016 10:05    Procedures Procedures (including critical care time)  CRITICAL CARE Performed by: Richardean Canal   Total critical care time:30 minutes  Critical care time was exclusive of separately billable procedures and treating other patients.  Critical care was necessary to treat or prevent imminent or life-threatening deterioration.  Critical care was time spent personally by me on the following activities: development of treatment plan with patient and/or surrogate as well as nursing, discussions with consultants, evaluation of patient's response to treatment, examination of patient, obtaining history from patient or surrogate, ordering and performing treatments and interventions, ordering and review of laboratory studies, ordering and review of radiographic studies, pulse oximetry and re-evaluation of patient's condition.  Foley placement  Placed 16 french foley after cleaning. Cloudy urine came out. No complications.    Medications Ordered in ED Medications  sodium chloride 0.9 % bolus 1,000 mL (0 mLs Intravenous Stopped 06/10/16 1039)    And  sodium chloride 0.9 % bolus 1,000 mL (0 mLs Intravenous Stopped 06/10/16 1209)    piperacillin-tazobactam (ZOSYN) IVPB 3.375 g (0 g Intravenous Stopped 06/10/16 1037)     Initial Impression / Assessment and Plan / ED Course  I have reviewed the triage vital signs and the nursing notes.  Pertinent labs & imaging results that were available during my care of the patient were reviewed by me and considered in my medical decision making (see chart for details).    [name] is a 83 y.o. female here with confusion, vomiting, diarrhea. Appears very dehydrated and confusion multi factorial. Hypothermic to 94 rectally. Code sepsis activated. Given 30 cc /kg bolus. Will give zosyn empirically. Will talk to family about goals of care    11 am Daughter at bedside. Updated her that mother has lactate 11. WBC 13. Acute renal failure likely from dehydration and vomiting. Zosyn given. I told her that the prognosis is poor. She doesn't know  if patient has advance directive or DNR in place.   12:39 PM CT head showed no bleed. CT chest/ab/pel unremarkable. Of note, foley not in place but bladder not distended. I placed foley and about 100 cc urine came out. Will admit for hypothermia, severe sepsis, acute renal failure, lactic acidosis.   Final Clinical Impressions(s) / ED Diagnoses   Final diagnoses:  None    New Prescriptions New Prescriptions   No medications on file     Charlynne PanderYao, David Hsienta, MD 06/10/16 1242

## 2016-06-10 NOTE — Progress Notes (Signed)
Pharmacy Antibiotic Note  Chrys RacerJoycestene B Davidian is a 83 y.o. female admitted on 06/10/2016 with severe dehydration, weakness, lethargy, diarrhea.  Pharmacy has been consulted for Cipro dosing.  SCr 7.1 with CrCl ~ 5 ml/min WBC 13.9 Hypothermia - Tmin 92.3, Tmax 95.4 Lactic acid 11.12 > 10.8  Plan: Cipro 200mg  IV q24h Follow up renal fxn, culture results, and clinical course.   Height: 5\' 5"  (165.1 cm) Weight: 110 lb (49.9 kg) IBW/kg (Calculated) : 57  Temp (24hrs), Avg:94.2 F (34.6 C), Min:92.3 F (33.5 C), Max:95.4 F (35.2 C)   Recent Labs Lab 06/10/16 0952 06/10/16 1005 06/10/16 1012 06/10/16 1308  WBC 13.9*  --   --   --   CREATININE 6.56*  --  7.10*  --   LATICACIDVEN  --  11.12*  --  10.80*    Estimated Creatinine Clearance: 4.9 mL/min (A) (by C-G formula based on SCr of 7.1 mg/dL (H)).    No Known Allergies  Antimicrobials this admission: 5/17 Metronidazole >>  5/17 Cipro >>   Dose adjustments this admission:   Microbiology results: 5/17 BCx:  5/17 UCx:   5/17 MRSA PCR:  5/17 GI Panel PCR:   Thank you for allowing pharmacy to be a part of this patient's care.  Lynann Beaverhristine Xianna Siverling PharmD, BCPS Pager [phone] 06/10/2016 6:10 PM

## 2016-06-10 NOTE — ED Notes (Signed)
Bed: WA13 Expected date:  Expected time:  Means of arrival:  Comments: EMS-N/V 

## 2016-06-10 NOTE — ED Notes (Signed)
CODE SEPSIS CALLED

## 2016-06-10 NOTE — ED Notes (Signed)
Hospitalist at bedside 

## 2016-06-11 DIAGNOSIS — Z7189 Other specified counseling: Secondary | ICD-10-CM

## 2016-06-11 DIAGNOSIS — Z515 Encounter for palliative care: Secondary | ICD-10-CM

## 2016-06-11 DIAGNOSIS — N179 Acute kidney failure, unspecified: Principal | ICD-10-CM

## 2016-06-11 LAB — GASTROINTESTINAL PANEL BY PCR, STOOL (REPLACES STOOL CULTURE)
ASTROVIRUS: NOT DETECTED
Adenovirus F40/41: NOT DETECTED
CAMPYLOBACTER SPECIES: NOT DETECTED
CYCLOSPORA CAYETANENSIS: NOT DETECTED
Cryptosporidium: NOT DETECTED
ENTAMOEBA HISTOLYTICA: NOT DETECTED
ENTEROTOXIGENIC E COLI (ETEC): NOT DETECTED
Enteroaggregative E coli (EAEC): NOT DETECTED
Enteropathogenic E coli (EPEC): NOT DETECTED
Giardia lamblia: NOT DETECTED
NOROVIRUS GI/GII: NOT DETECTED
PLESIMONAS SHIGELLOIDES: NOT DETECTED
Rotavirus A: NOT DETECTED
Salmonella species: NOT DETECTED
Sapovirus (I, II, IV, and V): NOT DETECTED
Shiga like toxin producing E coli (STEC): NOT DETECTED
Shigella/Enteroinvasive E coli (EIEC): NOT DETECTED
VIBRIO SPECIES: NOT DETECTED
Vibrio cholerae: NOT DETECTED
Yersinia enterocolitica: NOT DETECTED

## 2016-06-11 LAB — BASIC METABOLIC PANEL
ANION GAP: 28 — AB (ref 5–15)
BUN: 83 mg/dL — AB (ref 6–20)
CALCIUM: 8.6 mg/dL — AB (ref 8.9–10.3)
CO2: 10 mmol/L — AB (ref 22–32)
Chloride: 100 mmol/L — ABNORMAL LOW (ref 101–111)
Creatinine, Ser: 6.66 mg/dL — ABNORMAL HIGH (ref 0.44–1.00)
GFR calc Af Amer: 6 mL/min — ABNORMAL LOW (ref >60)
GFR, EST NON AFRICAN AMERICAN: 5 mL/min — AB (ref >60)
GLUCOSE: 202 mg/dL — AB (ref 65–99)
Potassium: 5.1 mmol/L (ref 3.5–5.1)
Sodium: 138 mmol/L (ref 135–145)

## 2016-06-11 LAB — CBC
HCT: 29.6 % — ABNORMAL LOW (ref 36.0–46.0)
HEMOGLOBIN: 9.9 g/dL — AB (ref 12.0–15.0)
MCH: 30 pg (ref 26.0–34.0)
MCHC: 33.4 g/dL (ref 30.0–36.0)
MCV: 89.7 fL (ref 78.0–100.0)
Platelets: 245 10*3/uL (ref 150–400)
RBC: 3.3 MIL/uL — ABNORMAL LOW (ref 3.87–5.11)
RDW: 15.3 % (ref 11.5–15.5)
WBC: 14.5 10*3/uL — ABNORMAL HIGH (ref 4.0–10.5)

## 2016-06-11 LAB — CORTISOL: CORTISOL PLASMA: 25.4 ug/dL

## 2016-06-11 LAB — GLUCOSE, CAPILLARY
GLUCOSE-CAPILLARY: 165 mg/dL — AB (ref 65–99)
GLUCOSE-CAPILLARY: 107 mg/dL — AB (ref 65–99)
Glucose-Capillary: 196 mg/dL — ABNORMAL HIGH (ref 65–99)
Glucose-Capillary: 165 mg/dL — ABNORMAL HIGH (ref 65–99)

## 2016-06-11 LAB — TSH: TSH: 4.291 u[IU]/mL (ref 0.350–4.500)

## 2016-06-11 NOTE — Care Management Note (Signed)
Case Management Note  Patient Details  Name: [name] MRN: [id number] Date of Birth: April 22, 1932  Subjective/Objective:                  83 y.o. female with medical history significant of advanced dementia-mostly bed bound-dependent on her daughter for all daily activities of living brought to the emergency room for evaluation of worsening lethargy and confusion along with diarrhea  Action/Plan: from home with spouse and adult children Date:  Jun 11, 2016  Chart reviewed for concurrent status and case management needs.  Will continue to follow patient progress.  Discharge Planning: following for needs  Expected discharge date: [id number]  Marcelle SmilingRhonda Barret Dorsey, BSN, [place]RN3, [place]CCM   [phone]   Expected Discharge Date:   (unknown)               Expected Discharge Plan:  Home/Self Care  In-House Referral:     Discharge planning Services  CM Consult  Post Acute Care Choice:    Choice offered to:     DME Arranged:    DME Agency:     HH Arranged:    HH Agency:     Status of Service:  In process, will continue to follow  If discussed at Long Length of Stay Meetings, dates discussed:    Additional Comments:  [name], Rhonda Kenna Lynn, RN 06/11/2016, 9:10 AM

## 2016-06-11 NOTE — Consult Note (Signed)
Consultation Note Date: 06/11/2016   Patient Name: [name]  DOB: 25-Nov-1932  MRN: [id number]  Age / Sex: 83 y.o., female  PCP: Nadyne Coombes, MD Referring Physician: Philip Aspen, Minerva Ends*  Reason for Consultation: Establishing goals of care  HPI/Patient Profile: 83 y.o. female  with past medical history of   admitted on 06/10/2016 with  .  83 y.o.femalewith medical history significant of advanced dementia-mostly bed bound-dependent on her daughter for all daily activities of living brought to the emergency room for evaluation of worsening lethargy and confusion along with diarrhea Clinical Assessment and Goals of Care:  Patient lives at home with her daughter who is her primary Dorsey, her husband reportedly also has dementia. She is bed bound at baseline. She has been admitted with lethargy and diarrhea, diarrhea has resolved, patient is no longer on antibiotics, she has worsening anion gap metabolic acidosis, acute kidney injury. She is not a dialysis candidate.   [name] is pleasantly confused, resting in bed, she is in no distress, she feels cold, she denies pain. She keeps talking about standing in one corner, not saying anything to anyone, she follows commands, she is able to state her name, says that her daughter's name is [name].   Call placed and discussed with [name] when she arrived by her mother's bedside this afternoon, brief life review performed, [name] is the primary Dorsey for the patient and her husband, patient is mostly blind, requires pretty much 24/7 assistance, has not been eating well for past several days with worsening diarrhea. [name] says she is adopted, that both the patient and her husband have been very good parents to her, she moved in with them in June 2017 to be able to care for them.  We discussed about dementia trajectory as well as acute problems Rhonda  Dickard is now facing. Goals and wishes also discussed, introduced hospice philosophy of care, discussed that Rhonda Laske is likely not a dialysis candidate, we talked about comfort care in detail, see recommendations below, thank you for the consult.   NEXT OF KIN  daughter [name] who is the patient's primary Dorsey  SUMMARY OF RECOMMENDATIONS    Agree with DNR DNI.  Goals of care: Discussed with daughter about the patient's serious irreversible illness, dementia, worsening renal function, metabolic acidosis, lack of eating. Recommend hospice services as an extra layer of support, based on the patient's advanced dementia, and acute kidney injury, agree that she is not a dialysis candidate, discussed about home with hospice versus residential hospice based on the patient's hospital disease trajectory and hospital course.  Prognosis could be limited to few weeks if ongoing lack of eating+ongoing worsening of renal function in the setting of advanced dementia discussed frankly and compassionately with the patient's daughter Thank you for the consult, we will continue to follow along.   Code Status/Advance Care Planning:  DNR    Symptom Management:    as above   Palliative Prophylaxis:   Bowel Regimen  Psycho-social/Spiritual:   Desire for further  Chaplaincy support:no  Additional Recommendations: Education on Hospice  Prognosis:   < 4 weeks  Discharge Planning: Hospice facility versus home with hospice, based on disease trajectory and further discussions.       Primary Diagnoses: Present on Admission: . ARF (acute renal failure) (HCC) . Diarrhea . Dementia . HTN (hypertension) . Failure to thrive (0-17) . Closed right hip fracture (HCC)   I have reviewed the medical record, interviewed the patient and family, and examined the patient. The following aspects are pertinent.  Past Medical History:  Diagnosis Date  . Dementia   . Diabetes mellitus without  complication (HCC)   . Glaucoma   . Hyperlipidemia   . Hypertension   . Hypothyroidism    Social History   Social History  . Marital status: Married    Spouse name: N/A  . Number of children: N/A  . Years of education: N/A   Social History Main Topics  . Smoking status: Never Smoker  . Smokeless tobacco: Never Used  . Alcohol use None  . Drug use: No  . Sexual activity: No   Other Topics Concern  . None   Social History Narrative  . None   History reviewed. No pertinent family history. Scheduled Meds: . heparin  5,000 Units Subcutaneous Q8H  . insulin aspart  0-9 Units Subcutaneous TID WC  . levothyroxine  25 mcg Oral QAC breakfast  . sodium chloride flush  3 mL Intravenous Q12H   Continuous Infusions: .  sodium bicarbonate  infusion 1000 mL 125 mL/hr at 06/11/16 0930   PRN Meds:.acetaminophen **OR** acetaminophen, albuterol, ondansetron **OR** ondansetron (ZOFRAN) IV Medications Prior to Admission:  Prior to Admission medications   Medication Sig Start Date End Date Taking? Authorizing Provider  benazepril-hydrochlorthiazide (LOTENSIN HCT) 20-12.5 MG per tablet Take 1 tablet by mouth daily.   Yes [provider]  Enalapril-Hydrochlorothiazide 5-12.5 MG tablet Take 1 tablet by mouth daily. 03/09/16  Yes [provider]  fish oil-omega-3 fatty acids 1000 MG capsule Take 2 g by mouth daily.   Yes [provider]  levothyroxine (SYNTHROID, LEVOTHROID) 25 MCG tablet Take 25 mcg by mouth daily before breakfast.    Yes [provider]  metFORMIN (GLUCOPHAGE) 1000 MG tablet Take 1,000 mg by mouth 2 (two) times daily with a meal.   Yes [provider]  simvastatin (ZOCOR) 40 MG tablet Take 40 mg by mouth every evening.   Yes [provider]   No Known Allergies Review of Systems Confused  Physical Exam Frail weak elderly lady Is able to state her name Is confused S1 S2 Thin extremities, no edema Shallow clear  breath sounds anteriorly Abdomen soft non distended no tenderness  Vital Signs: BP 112/67 (BP Location: Right Arm)   Pulse 80   Temp 97.8 F (36.6 C) (Oral)   Resp (!) 21   Ht 5\' 4"  (1.626 m)   Wt 45.7 kg (100 lb 12 oz)   SpO2 100%   BMI 17.29 kg/m  Pain Assessment: No/denies pain       SpO2: SpO2: 100 % O2 Device:SpO2: 100 % O2 Flow Rate: .O2 Flow Rate (L/min): 5 L/min  IO: Intake/output summary:  Intake/Output Summary (Last 24 hours) at 06/11/16 1240 Last data filed at 06/11/16 1200  Gross per 24 hour  Intake          2637.08 ml  Output              300 ml  Net  2337.08 ml    LBM: Last BM Date: 06/10/16 Baseline Weight: Weight: 49.9 kg (110 lb) Most recent weight: Weight: 45.7 kg (100 lb 12 oz)     Palliative Assessment/Data:   Flowsheet Rows     Most Recent Value  Intake Tab  Referral Department  Hospitalist  Unit at Time of Referral  Intermediate Care Unit  Palliative Care Primary Diagnosis  Other (Comment) [dementia, renal failure, diarrhea, not dialysis candidate. ]  Palliative Care Type  New Palliative care  Reason for referral  Clarify Goals of Care, Non-pain Symptom, Counsel Regarding Hospice  Date first seen by Palliative Care  06/11/16  Clinical Assessment  Palliative Performance Scale Score  20%  Pain Max last 24 hours  4  Pain Min Last 24 hours  3  Dyspnea Max Last 24 Hours  4  Dyspnea Min Last 24 hours  3  Nausea Max Last 24 Hours  3  Nausea Min Last 24 Hours  2  Psychosocial & Spiritual Assessment  Palliative Care Outcomes  Patient/Family meeting held?  Yes  Who was at the meeting?  patient, daughter [name].   Palliative Care Outcomes  Clarified goals of care      Time In:  11 Time Out:  12.10 Time Total:  70 min  Greater than 50%  of this time was spent counseling and coordinating care related to the above assessment and plan.  Signed by: Rosalin Hawking, MD  [phone]  Please contact Palliative  Medicine Team phone at [phone] for questions and concerns.  For individual provider: See Loretha Stapler

## 2016-06-11 NOTE — Progress Notes (Signed)
Patient transferring to room 1438.  Report called to Victorino DikeJennifer, Charity fundraiserN.  Patient to be transported by bed.  Will continue to monitor.

## 2016-06-11 NOTE — Progress Notes (Signed)
Nutrition Brief Note  Patient identified on low Braden screening report.  Wt Readings from Last 15 Encounters:  06/11/16 100 lb 12 oz (45.7 kg)  11/13/12 135 lb (61.2 kg)  08/26/14  125 lbs (56.7 kg)       [facility]  Body mass index is 17.29 kg/m. Patient meets criteria for underweight based on current BMI. Skin WDL. Current diet order is CLD, patient is consuming approximately 0% of meals at this time. Labs and medications reviewed. Pt admitted for increasing lethargy/confusion (with hx of advanced dementia) and diarrhea. Diarrhea has since resolved. Pt and daughter were seen by Palliative Care earlier this afternoon and note from that encounter reviewed; plan to d/c to hospice facility or go home with hospice and prognosis <4 weeks.   No nutrition interventions warranted at this time. If nutrition issues arise, please consult RD.     Trenton GammonJessica Ilya Neely, MS, RD, LDN, [facility] Inpatient Clinical Dietitian Pager # [phone] After hours/weekend pager # [phone]

## 2016-06-11 NOTE — Progress Notes (Signed)
PROGRESS NOTE    [name]  JXB:[id number] DOB: 11/13/1932 DOA: 06/10/2016 PCP: Nadyne Coombes, MD     Brief Narrative:  83 y/o woman admitted from home on 5/17 due to weakness, lethargy and diarrhea. Has a h/o dementia. Was found to have significant ARF with gap acidosis and admission was requested.   Assessment & Plan:   Principal Problem:   ARF (acute renal failure) (HCC) Active Problems:   Diarrhea   Dementia   HTN (hypertension)   DM (diabetes mellitus) (HCC)   Failure to thrive (0-17)   Closed right hip fracture (HCC)   ARF with GAP Metabolic Acidosis -Renal function without significant improvement despite IVF hydration overnight. -Has been deemed a poor dialysis candidate (I agree). -Continue IVF for now in hopes of some renal recovery. -Foley catheter in place. -Continue IVF with bicarbonate.  Diarrhea -No diarrhea since admission. -DC cipro/flagyl.  Hypothermia -Still hypothermic despite Lawyer. -TSH, cortisol WNL.  Dementia -Appears to be at baseline. -Seems pleasant without behavioral disturbances.  HTN -Well controlled. -Not currently on any medications.  Adult FTT -Due to metabolic derrangements on top of baseline dementia.  Hypothyroidism -Continue synthroid. -TSH WNL.   DVT prophylaxis: SQ heparin Code Status: DNR Family Communication: Discussed with daughter via phone Disposition Plan: Will proceed with palliative care evaluation  Consultants:   Palliative care  Procedures:   None  Antimicrobials:  Anti-infectives    Start     Dose/Rate Route Frequency Ordered Stop   06/10/16 2000  ciprofloxacin (CIPRO) IVPB 200 mg     200 mg 100 mL/hr over 60 Minutes Intravenous Every 24 hours 06/10/16 1759     06/10/16 1800  metroNIDAZOLE (FLAGYL) IVPB 500 mg     500 mg 100 mL/hr over 60 Minutes Intravenous Every 8 hours 06/10/16 1755     06/10/16 1015  piperacillin-tazobactam (ZOSYN) IVPB 3.375 g     3.375 g 100  mL/hr over 30 Minutes Intravenous  Once 06/10/16 1002 06/10/16 1037       Subjective: Can answer simple questions, does not appear to be in any distress.  Objective: Vitals:   06/11/16 0300 06/11/16 0446 06/11/16 0612 06/11/16 0700  BP:      Pulse:    72  Resp: 17   13  Temp:   (!) 96 F (35.6 C)   TempSrc:   Rectal   SpO2:    93%  Weight:  45.7 kg (100 lb 12 oz)    Height:        Intake/Output Summary (Last 24 hours) at 06/11/16 1308 Last data filed at 06/11/16 0700  Gross per 24 hour  Intake          2827.08 ml  Output              300 ml  Net          2527.08 ml   Filed Weights   06/10/16 1018 06/10/16 1818 06/11/16 0446  Weight: 49.9 kg (110 lb) 45.7 kg (100 lb 12 oz) 45.7 kg (100 lb 12 oz)    Examination:  General exam:  awake, oriented to self and place Respiratory system: Clear to auscultation. Respiratory effort normal. Cardiovascular system:RRR. No murmurs, rubs, gallops. Gastrointestinal system: Abdomen is nondistended, soft and nontender. No organomegaly or masses felt. Normal bowel sounds heard. Central nervous system: Moves all 4 spontaneously Extremities: No C/C/E, +pedal pulses Skin: No rashes, lesions or ulcers Psychiatry: Unable to fully assess given current mental state  Data Reviewed: I have personally reviewed following labs and imaging studies  CBC:  Recent Labs Lab 06/10/16 0952 06/10/16 1012 06/11/16 0325  WBC 13.9*  --  14.5*  NEUTROABS 12.2*  --   --   HGB 9.8* 10.5* 9.9*  HCT 31.4* 31.0* 29.6*  MCV 94.6  --  89.7  PLT 289  --  245   Basic Metabolic Panel:  Recent Labs Lab 06/10/16 0952 06/10/16 1012 06/11/16 0325  NA 136 135 138  K 5.3* 5.3* 5.1  CL 101 107 100*  CO2 9*  --  10*  GLUCOSE 211* 202* 202*  BUN 82* 84* 83*  CREATININE 6.56* 7.10* 6.66*  CALCIUM 9.7  --  8.6*   GFR: Estimated Creatinine Clearance: 4.8 mL/min (A) (by C-G formula based on SCr of 6.66 mg/dL (H)). Liver Function Tests:  Recent  Labs Lab 06/10/16 0952  AST 27  ALT 9*  ALKPHOS 60  BILITOT 0.6  PROT 8.2*  ALBUMIN 3.8   No results for input(s): LIPASE, AMYLASE in the last 168 hours. No results for input(s): AMMONIA in the last 168 hours. Coagulation Profile: No results for input(s): INR, PROTIME in the last 168 hours. Cardiac Enzymes: No results for input(s): CKTOTAL, CKMB, CKMBINDEX, TROPONINI in the last 168 hours. BNP (last 3 results) No results for input(s): PROBNP in the last 8760 hours. HbA1C: No results for input(s): HGBA1C in the last 72 hours. CBG:  Recent Labs Lab 06/10/16 0954 06/10/16 2133 06/11/16 0744  GLUCAP 207* 193* 196*   Lipid Profile: No results for input(s): CHOL, HDL, LDLCALC, TRIG, CHOLHDL, LDLDIRECT in the last 72 hours. Thyroid Function Tests:  Recent Labs  06/11/16 0325  TSH 4.291   Anemia Panel: No results for input(s): VITAMINB12, FOLATE, FERRITIN, TIBC, IRON, RETICCTPCT in the last 72 hours. Urine analysis:    Component Value Date/Time   COLORURINE YELLOW 09/07/2007 1200   APPEARANCEUR CLEAR 09/07/2007 1200   LABSPEC 1.022 09/07/2007 1200   PHURINE 7.0 09/07/2007 1200   GLUCOSEU NEGATIVE 09/07/2007 1200   HGBUR NEGATIVE 09/07/2007 1200   BILIRUBINUR NEGATIVE 09/07/2007 1200   KETONESUR NEGATIVE 09/07/2007 1200   PROTEINUR NEGATIVE 09/07/2007 1200   UROBILINOGEN 1.0 09/07/2007 1200   NITRITE NEGATIVE 09/07/2007 1200   LEUKOCYTESUR TRACE (A) 09/07/2007 1200   Sepsis Labs: @LABRCNTIP (procalcitonin:4,lacticidven:4)  ) Recent Results (from the past 240 hour(s))  MRSA PCR Screening     Status: None   Collection Time: 06/10/16  6:20 PM  Result Value Ref Range Status   MRSA by PCR NEGATIVE NEGATIVE Final    Comment:        The GeneXpert MRSA Assay (FDA approved for NASAL specimens only), is one component of a comprehensive MRSA colonization surveillance program. It is not intended to diagnose MRSA infection nor to guide or monitor treatment for MRSA  infections.          Radiology Studies: Ct Abdomen Pelvis Wo Contrast  Result Date: 06/10/2016 CLINICAL DATA:  Nausea and vomiting for 3 days EXAM: CT CHEST, ABDOMEN AND PELVIS WITHOUT CONTRAST TECHNIQUE: Multidetector CT imaging of the chest, abdomen and pelvis was performed following the standard protocol without IV contrast. COMPARISON:  None. FINDINGS: CT CHEST FINDINGS Cardiovascular: Diffuse aortic calcifications are noted. No aneurysmal dilatation is seen. Coronary calcifications are noted. No cardiac enlargement is seen. Mediastinum/Nodes: Thoracic inlet demonstrates some heterogeneity of the right thyroid gland. No discrete nodule is seen. No hilar or mediastinal adenopathy is noted. Lungs/Pleura: Lungs are well aerated bilaterally. Mild  septal thickening is noted likely of a chronic nature. Mild right basilar infiltrative change is seen posteriorly. No sizable effusion is noted. Musculoskeletal: Degenerative changes of thoracic spine are noted. No acute compression deformity is seen. No definitive rib abnormality is noted. CT ABDOMEN PELVIS FINDINGS Hepatobiliary: No focal liver abnormality is seen. No gallstones, gallbladder wall thickening, or biliary dilatation. Pancreas: Unremarkable. No pancreatic ductal dilatation or surrounding inflammatory changes. Spleen: Normal in size without focal abnormality. Adrenals/Urinary Tract: Adrenal glands are within normal limits. Nonobstructing right renal stones are seen. A dominant right renal cyst is noted inferiorly measuring approximately 2.8 cm. A hyperdense 10 mm lesion is noted in the left kidney likely representing hyperdense cyst. Bladder is well distended. Stomach/Bowel: No obstructive changes are seen. No inflammatory changes are seen. The appendix is not well appreciated. Stomach is well distended. A small sliding-type hiatal hernia is noted. Vascular/Lymphatic: Aortic atherosclerosis. No enlarged abdominal or pelvic lymph nodes. Reproductive:  The uterus has been removed. Foley catheter is seen this placed within the vaginal cuff. Clinical correlation with urine output is recommended. Other: No abdominal wall hernia or abnormality. No abdominopelvic ascites. Musculoskeletal: Degenerative changes of the lumbar spine are noted. No compression deformity is seen. IMPRESSION: Foley catheter appears to be placed within the vaginal cuff. Correlation with urine output is recommended. The bladder remains well distended. Nonobstructing right renal stones. Bilateral renal cysts. No other acute abnormality is noted. Electronically Signed   By: Alcide Clever M.D.   On: 06/10/2016 11:26   Ct Head Wo Contrast  Result Date: 06/10/2016 CLINICAL DATA:  Altered mental status. Nausea, vomiting, and diarrhea for 3 days. History of Alzheimer's. EXAM: CT HEAD WITHOUT CONTRAST TECHNIQUE: Contiguous axial images were obtained from the base of the skull through the vertex without intravenous contrast. COMPARISON:  Brain MRI 05/17/2011 FINDINGS: Brain: Lateral and third ventriculomegaly is unchanged and felt to reflect moderate cerebral atrophy rather than hydrocephalus. Periventricular white matter hypodensities are nonspecific but compatible with mild-to-moderate chronic small vessel ischemic disease. A chronic lacunar infarct is again noted in the right thalamus. There is no evidence of acute infarct, intracranial hemorrhage, mass, midline shift, or extra-axial fluid collection. Vascular: Calcified atherosclerosis at the skullbase. No hyperdense vessel. Skull: No fracture or focal osseous lesion. Sinuses/Orbits: Chronic left sphenoid sinusitis. Postoperative changes to both globes. Clear mastoid air cells. Other: None. IMPRESSION: 1. No evidence of acute intracranial abnormality. 2. Mild-to-moderate chronic small vessel ischemic disease and moderate cerebral atrophy. Electronically Signed   By: Sebastian Ache M.D.   On: 06/10/2016 11:18   Ct Chest Wo Contrast  Result Date:  06/10/2016 CLINICAL DATA:  Nausea and vomiting for 3 days EXAM: CT CHEST, ABDOMEN AND PELVIS WITHOUT CONTRAST TECHNIQUE: Multidetector CT imaging of the chest, abdomen and pelvis was performed following the standard protocol without IV contrast. COMPARISON:  None. FINDINGS: CT CHEST FINDINGS Cardiovascular: Diffuse aortic calcifications are noted. No aneurysmal dilatation is seen. Coronary calcifications are noted. No cardiac enlargement is seen. Mediastinum/Nodes: Thoracic inlet demonstrates some heterogeneity of the right thyroid gland. No discrete nodule is seen. No hilar or mediastinal adenopathy is noted. Lungs/Pleura: Lungs are well aerated bilaterally. Mild septal thickening is noted likely of a chronic nature. Mild right basilar infiltrative change is seen posteriorly. No sizable effusion is noted. Musculoskeletal: Degenerative changes of thoracic spine are noted. No acute compression deformity is seen. No definitive rib abnormality is noted. CT ABDOMEN PELVIS FINDINGS Hepatobiliary: No focal liver abnormality is seen. No gallstones, gallbladder wall thickening, or  biliary dilatation. Pancreas: Unremarkable. No pancreatic ductal dilatation or surrounding inflammatory changes. Spleen: Normal in size without focal abnormality. Adrenals/Urinary Tract: Adrenal glands are within normal limits. Nonobstructing right renal stones are seen. A dominant right renal cyst is noted inferiorly measuring approximately 2.8 cm. A hyperdense 10 mm lesion is noted in the left kidney likely representing hyperdense cyst. Bladder is well distended. Stomach/Bowel: No obstructive changes are seen. No inflammatory changes are seen. The appendix is not well appreciated. Stomach is well distended. A small sliding-type hiatal hernia is noted. Vascular/Lymphatic: Aortic atherosclerosis. No enlarged abdominal or pelvic lymph nodes. Reproductive: The uterus has been removed. Foley catheter is seen this placed within the vaginal cuff.  Clinical correlation with urine output is recommended. Other: No abdominal wall hernia or abnormality. No abdominopelvic ascites. Musculoskeletal: Degenerative changes of the lumbar spine are noted. No compression deformity is seen. IMPRESSION: Foley catheter appears to be placed within the vaginal cuff. Correlation with urine output is recommended. The bladder remains well distended. Nonobstructing right renal stones. Bilateral renal cysts. No other acute abnormality is noted. Electronically Signed   By: Alcide Clever M.D.   On: 06/10/2016 11:26   Dg Chest Port 1 View  Result Date: 06/10/2016 CLINICAL DATA:  Altered mental status. EXAM: PORTABLE CHEST 1 VIEW COMPARISON:  10/11/2007 FINDINGS: Heart size is normal. There is aortic atherosclerosis. The lungs are clear. The vascularity is normal. No effusions. No significant bone finding. IMPRESSION: No active disease.  Aortic atherosclerosis. Electronically Signed   By: Paulina Fusi M.D.   On: 06/10/2016 10:05        Scheduled Meds: . heparin  5,000 Units Subcutaneous Q8H  . insulin aspart  0-9 Units Subcutaneous TID WC  . levothyroxine  25 mcg Oral QAC breakfast  . sodium chloride flush  3 mL Intravenous Q12H   Continuous Infusions: . ciprofloxacin Stopped (06/10/16 2200)  . metronidazole Stopped (06/11/16 0334)  .  sodium bicarbonate  infusion 1000 mL 125 mL/hr at 06/11/16 0017     LOS: 1 day    Time spent: 35 minutes. Greater than 50% of this time was spent in direct contact with the patient coordinating care.     Chaya Jan, MD [facility] Pager [phone]  If 7PM-7AM, please contact night-coverage [email or web address] Password [email or web address] 06/11/2016, 8:22 AM

## 2016-06-12 LAB — GLUCOSE, CAPILLARY
GLUCOSE-CAPILLARY: 125 mg/dL — AB (ref 65–99)
GLUCOSE-CAPILLARY: 224 mg/dL — AB (ref 65–99)
Glucose-Capillary: 66 mg/dL (ref 65–99)
Glucose-Capillary: 99 mg/dL (ref 65–99)
Glucose-Capillary: 179 mg/dL — ABNORMAL HIGH (ref 65–99)

## 2016-06-12 LAB — BASIC METABOLIC PANEL
ANION GAP: 17 — AB (ref 5–15)
BUN: 79 mg/dL — ABNORMAL HIGH (ref 6–20)
CALCIUM: 8 mg/dL — AB (ref 8.9–10.3)
CO2: 30 mmol/L (ref 22–32)
Chloride: 92 mmol/L — ABNORMAL LOW (ref 101–111)
Creatinine, Ser: 6.32 mg/dL — ABNORMAL HIGH (ref 0.44–1.00)
GFR, EST AFRICAN AMERICAN: 6 mL/min — AB (ref >60)
GFR, EST NON AFRICAN AMERICAN: 6 mL/min — AB (ref >60)
Glucose, Bld: 110 mg/dL — ABNORMAL HIGH (ref 65–99)
Potassium: 3.9 mmol/L (ref 3.5–5.1)
Sodium: 139 mmol/L (ref 135–145)

## 2016-06-12 NOTE — Progress Notes (Signed)
Hypoglycemic Event  CBG:66  Treatment: 15 GM carbohydrate snack  Symptoms: None  Follow-up CBG: [id number]:[id number] CBG Result:99  Possible Reasons for Event: Inadequate meal intake  Comments/MD notified:Hernandez    [name], Rhonda SidleKatelyn G

## 2016-06-12 NOTE — Consult Note (Signed)
[facility] Liaison  Received request from CSW [place] for [facility] interest [facility]. Chart reviewed and spoke with patient's daughter and niece by phone to confirm interest. Plan to meet with both tomorrow to complete paper work for transfer [facility] tomorrow. Will update CSW tomorrow after paper work is complete.  Thank you,  Forrestine Himva Davis, LCSW [phone]

## 2016-06-12 NOTE — Progress Notes (Signed)
Daily Progress Note   Patient Name: [name]       Date: 06/12/2016 DOB: 02/25/1932  Age: 83 y.o. MRN#: [id number] Attending Physician: Philip Aspen, Minerva Ends* Primary Care Physician: Nadyne Coombes, MD Admit Date: 06/10/2016  Reason for Consultation/Follow-up: Establishing goals of care  Subjective:  appears weak, lethargic, does not open eyes when name is called Daughter not at bedside see below   Length of Stay: 2  Current Medications: Scheduled Meds:  . heparin  5,000 Units Subcutaneous Q8H  . insulin aspart  0-9 Units Subcutaneous TID WC  . levothyroxine  25 mcg Oral QAC breakfast  . sodium chloride flush  3 mL Intravenous Q12H    Continuous Infusions: .  sodium bicarbonate  infusion 1000 mL 125 mL/hr at 06/12/16 0804    PRN Meds: acetaminophen **OR** acetaminophen, albuterol, ondansetron **OR** ondansetron (ZOFRAN) IV  Physical Exam         Weak frail elderly lady S1 S2 Shallow clear Abdomen soft No edema  Vital Signs: BP 99/60 (BP Location: Right Arm)   Pulse 80   Temp 97.7 F (36.5 C) (Oral)   Resp 16   Ht 5\' 3"  (1.6 m)   Wt 45.7 kg (100 lb 12 oz)   SpO2 95%   BMI 17.85 kg/m  SpO2: SpO2: 95 % O2 Device: O2 Device: Nasal Cannula O2 Flow Rate: O2 Flow Rate (L/min): 5 L/min  Intake/output summary:  Intake/Output Summary (Last 24 hours) at 06/12/16 0932 Last data filed at 06/12/16 0548  Gross per 24 hour  Intake             3845 ml  Output              200 ml  Net             3645 ml   LBM: Last BM Date: 06/10/16 Baseline Weight: Weight: 49.9 kg (110 lb) Most recent weight: Weight: 45.7 kg (100 lb 12 oz)       Palliative Assessment/Data:    Flowsheet Rows     Most Recent Value  Intake Tab  Referral Department  Hospitalist    Unit at Time of Referral  Intermediate Care Unit  Palliative Care Primary Diagnosis  Other (Comment) [dementia, renal failure, diarrhea, not dialysis candidate. ]  Date Notified  06/10/16  Palliative Care Type  New Palliative care  Reason for referral  Clarify Goals of Care, Non-pain Symptom, Counsel Regarding Hospice  Date of Admission  06/10/16  Date first seen by Palliative Care  06/11/16  # of days Palliative referral response time  1 Day(s)  # of days IP prior to Palliative referral  0  Clinical Assessment  Palliative Performance Scale Score  20%  Pain Max last 24 hours  4  Pain Min Last 24 hours  3  Dyspnea Max Last 24 Hours  4  Dyspnea Min Last 24 hours  3  Nausea Max Last 24 Hours  3  Nausea Min Last 24 Hours  2  Psychosocial & Spiritual Assessment  Palliative Care Outcomes  Patient/Family meeting held?  Yes  Who was at the meeting?  patient, daughter [name] who is her primary caregiver.   Palliative Care Outcomes  Clarified goals of care      Patient Active Problem List   Diagnosis Date Noted  . Encounter for palliative care   . Goals of care, counseling/discussion   . AKI (acute kidney injury) (HCC)   . ARF (acute renal failure) (HCC) 06/10/2016  . Diarrhea 06/10/2016  . Dementia 06/10/2016  . HTN (hypertension) 06/10/2016  . DM (diabetes mellitus) (HCC) 06/10/2016  . Failure to thrive (0-17) 06/10/2016  . Closed right hip fracture (HCC) 06/10/2016    Palliative Care Assessment & Plan   Patient Profile:   Assessment:  83 y.o.femalewith medical history significant of advanced dementia-mostly bed bound-dependent on her daughter for all daily activities of living brought to the emergency room for evaluation of worsening lethargy and confusion along with diarrhea Has dementia with gradual progressive decline Acute renal failure High anion GAP- resolving with IVF Functional decline Lack of eating, did tolerate some soup today Low blood  sugars  Recommendations/Plan:  as per my discussions with daughter, recommend residential hospice, patient has advanced dementia, acute renal failure, not a dialysis candidate.  Continue comfort feeds as the patient is able to tolerate  Code Status:    Code Status Orders        Start     Ordered   06/10/16 1756  Do not attempt resuscitation (DNR)  Continuous    Question Answer Comment  In the event of cardiac or respiratory ARREST Do not call a "code blue"   In the event of cardiac or respiratory ARREST Do not perform Intubation, CPR, defibrillation or ACLS   In the event of cardiac or respiratory ARREST Use medication by any route, position, wound care, and other measures to relive pain and suffering. May use oxygen, suction and manual treatment of airway obstruction as needed for comfort.      06/10/16 1755    Code Status History    Date Active Date Inactive Code Status Order ID Comments User Context   This patient has a current code status but no historical code status.       Prognosis:   < 2 weeks  Discharge Planning:  Hospice facility  Care plan was discussed with  RN  Thank you for allowing the Palliative Medicine Team to assist in the care of this patient.   Time In: 9 Time Out: 9.25 Total Time 25 Prolonged Time Billed  no       Greater than 50%  of this time was spent counseling and coordinating care related to the above assessment and plan.  Rosalin HawkingZeba Kemesha Mosey, MD [phone]  Please contact Palliative Medicine Team  phone at [phone] for questions and concerns.

## 2016-06-12 NOTE — Progress Notes (Signed)
PROGRESS NOTE    [name]  OFH:[id number] DOB: 02/20/1932 DOA: 06/10/2016 PCP: Ara Kussmaul, MD     Brief Narrative:  83 y/o woman admitted from home on 5/17 due to weakness, lethargy and diarrhea. Has a h/o dementia. Was found to have significant ARF with gap acidosis and admission was requested.   Assessment & Plan:   Principal Problem:   ARF (acute renal failure) (HCC) Active Problems:   Diarrhea   Dementia   HTN (hypertension)   DM (diabetes mellitus) ([place])   Failure to thrive (0-17)   Closed right hip fracture (HCC)   Encounter for palliative care   Goals of care, counseling/discussion   AKI (acute kidney injury) ([place])   ARF with GAP Metabolic Acidosis -Renal function without significant improvement despite IVF. -Has been deemed a poor dialysis candidate (I agree). -Continue IVF with bicarbonate. -Palliative care has met with patient's daughter and they would like to pursue residential hospice placement. SW is aware.  Diarrhea -No diarrhea since admission. -DC cipro/flagyl.  Hypothermia -Resolved. -TSH/cortisol WNL.  Dementia -Appears to be at baseline. -Seems pleasant without behavioral disturbances.  HTN -Well controlled. -Not currently on any medications.  Adult FTT -Due to metabolic derrangements on top of baseline dementia.  Hypothyroidism -Continue synthroid. -TSH WNL.   DVT prophylaxis: SQ heparin Code Status: DNR Family Communication: Discussed with daughter via phone 5/18 Disposition Plan: Residential hospice  Consultants:   Palliative care  Procedures:   None  Antimicrobials:  Anti-infectives    Start     Dose/Rate Route Frequency Ordered Stop   06/10/16 2000  ciprofloxacin (CIPRO) IVPB 200 mg  Status:  Discontinued     200 mg 100 mL/hr over 60 Minutes Intravenous Every 24 hours 06/10/16 1759 06/11/16 0836   06/10/16 1800  metroNIDAZOLE (FLAGYL) IVPB 500 mg  Status:  Discontinued     500 mg 100 mL/hr over  60 Minutes Intravenous Every 8 hours 06/10/16 1755 06/11/16 0836   06/10/16 1015  piperacillin-tazobactam (ZOSYN) IVPB 3.375 g     3.375 g 100 mL/hr over 30 Minutes Intravenous  Once 06/10/16 1002 06/10/16 1037       Subjective: Talking to herself in the room. When I say hello she asks me how am I doing and then keeps on talking to herself.  Objective: Vitals:   06/11/16 1600 06/11/16 1900 06/12/16 0547 06/12/16 1359  BP: (!) 117/50 125/66 99/60 109/62  Pulse: 71 (!) 123 80 68  Resp: '16 15 16 14  '$ Temp: 97.5 F (36.4 C) 97.3 F (36.3 C) 97.7 F (36.5 C) 97.5 F (36.4 C)  TempSrc: Oral Oral Oral Axillary  SpO2: 98% 91% 95% 94%  Weight:      Height:  '5\' 3"'$  (1.6 m)      Intake/Output Summary (Last 24 hours) at 06/12/16 1403 Last data filed at 06/12/16 1121  Gross per 24 hour  Intake             3100 ml  Output              975 ml  Net             2125 ml   Filed Weights   06/10/16 1018 06/10/16 1818 06/11/16 0446  Weight: 49.9 kg (110 lb) 45.7 kg (100 lb 12 oz) 45.7 kg (100 lb 12 oz)    Examination:  General exam: Awake, not oriented. Respiratory system: Clear to auscultation. Respiratory effort normal. Cardiovascular system:RRR. No murmurs, rubs, gallops. Gastrointestinal system:  Abdomen is nondistended, soft and nontender. No organomegaly or masses felt. Normal bowel sounds heard. Central nervous system: Moves all 4 spontaneously. Extremities: No C/C/E, +pedal pulses Psychiatry: Unable to assess given current mental state     Data Reviewed: I have personally reviewed following labs and imaging studies  CBC:  Recent Labs Lab 06/10/16 0952 06/10/16 1012 06/11/16 0325  WBC 13.9*  --  14.5*  NEUTROABS 12.2*  --   --   HGB 9.8* 10.5* 9.9*  HCT 31.4* 31.0* 29.6*  MCV 94.6  --  89.7  PLT 289  --  016   Basic Metabolic Panel:  Recent Labs Lab 06/10/16 0952 06/10/16 1012 06/11/16 0325 06/12/16 0538  NA 136 135 138 139  K 5.3* 5.3* 5.1 3.9  CL 101  107 100* 92*  CO2 9*  --  10* 30  GLUCOSE 211* 202* 202* 110*  BUN 82* 84* 83* 79*  CREATININE 6.56* 7.10* 6.66* 6.32*  CALCIUM 9.7  --  8.6* 8.0*   GFR: Estimated Creatinine Clearance: 5 mL/min (A) (by C-G formula based on SCr of 6.32 mg/dL (H)). Liver Function Tests:  Recent Labs Lab 06/10/16 0952  AST 27  ALT 9*  ALKPHOS 60  BILITOT 0.6  PROT 8.2*  ALBUMIN 3.8   No results for input(s): LIPASE, AMYLASE in the last 168 hours. No results for input(s): AMMONIA in the last 168 hours. Coagulation Profile: No results for input(s): INR, PROTIME in the last 168 hours. Cardiac Enzymes: No results for input(s): CKTOTAL, CKMB, CKMBINDEX, TROPONINI in the last 168 hours. BNP (last 3 results) No results for input(s): PROBNP in the last 8760 hours. HbA1C: No results for input(s): HGBA1C in the last 72 hours. CBG:  Recent Labs Lab 06/11/16 1636 06/11/16 2124 06/12/16 0740 06/12/16 0837 06/12/16 1200  GLUCAP 165* 107* 66 99 125*   Lipid Profile: No results for input(s): CHOL, HDL, LDLCALC, TRIG, CHOLHDL, LDLDIRECT in the last 72 hours. Thyroid Function Tests:  Recent Labs  06/11/16 0325  TSH 4.291   Anemia Panel: No results for input(s): VITAMINB12, FOLATE, FERRITIN, TIBC, IRON, RETICCTPCT in the last 72 hours. Urine analysis:    Component Value Date/Time   COLORURINE YELLOW 09/07/2007 1200   APPEARANCEUR CLEAR 09/07/2007 1200   LABSPEC 1.022 09/07/2007 1200   PHURINE 7.0 09/07/2007 1200   GLUCOSEU NEGATIVE 09/07/2007 1200   HGBUR NEGATIVE 09/07/2007 1200   BILIRUBINUR NEGATIVE 09/07/2007 1200   KETONESUR NEGATIVE 09/07/2007 1200   PROTEINUR NEGATIVE 09/07/2007 1200   UROBILINOGEN 1.0 09/07/2007 1200   NITRITE NEGATIVE 09/07/2007 1200   LEUKOCYTESUR TRACE (A) 09/07/2007 1200   Sepsis Labs: '@LABRCNTIP'$ (procalcitonin:4,lacticidven:4)  ) Recent Results (from the past 240 hour(s))  Blood Culture (routine x 2)     Status: None (Preliminary result)   Collection  Time: 06/10/16  9:52 AM  Result Value Ref Range Status   Specimen Description BLOOD LEFT ANTECUBITAL  Final   Special Requests   Final    BOTTLES DRAWN AEROBIC AND ANAEROBIC Blood Culture adequate volume   Culture   Final    NO GROWTH 1 DAY Performed at [facility], [place] [address], [place], [place] [address]    Report Status PENDING  Incomplete  Blood Culture (routine x 2)     Status: None (Preliminary result)   Collection Time: 06/10/16 10:11 AM  Result Value Ref Range Status   Specimen Description BLOOD RIGHT ARM  Final   Special Requests AEROBIC BOTTLE ONLY Blood Culture adequate volume  Final   Culture   Final    NO GROWTH 1 DAY Performed at [facility], [place] [address], [place], [place] [address]    Report Status PENDING  Incomplete  Gastrointestinal Panel by PCR , Stool     Status: None   Collection Time: 06/10/16  6:13 PM  Result Value Ref Range Status   Campylobacter species NOT DETECTED NOT DETECTED Final   Plesimonas shigelloides NOT DETECTED NOT DETECTED Final   Salmonella species NOT DETECTED NOT DETECTED Final   Yersinia enterocolitica NOT DETECTED NOT DETECTED Final   Vibrio species NOT DETECTED NOT DETECTED Final   Vibrio cholerae NOT DETECTED NOT DETECTED Final   Enteroaggregative E coli (EAEC) NOT DETECTED NOT DETECTED Final   Enteropathogenic E coli (EPEC) NOT DETECTED NOT DETECTED Final   Enterotoxigenic E coli (ETEC) NOT DETECTED NOT DETECTED Final   Shiga like toxin producing E coli (STEC) NOT DETECTED NOT DETECTED Final   Shigella/Enteroinvasive E coli (EIEC) NOT DETECTED NOT DETECTED Final   Cryptosporidium NOT DETECTED NOT DETECTED Final   Cyclospora cayetanensis NOT DETECTED NOT DETECTED Final   Entamoeba histolytica NOT DETECTED NOT DETECTED Final   Giardia lamblia NOT DETECTED NOT DETECTED Final   Adenovirus F40/41 NOT DETECTED NOT DETECTED Final   Astrovirus NOT DETECTED NOT DETECTED Final   Norovirus GI/GII NOT DETECTED NOT DETECTED  Final   Rotavirus A NOT DETECTED NOT DETECTED Final   Sapovirus (I, II, IV, and V) NOT DETECTED NOT DETECTED Final  MRSA PCR Screening     Status: None   Collection Time: 06/10/16  6:20 PM  Result Value Ref Range Status   MRSA by PCR NEGATIVE NEGATIVE Final    Comment:        The GeneXpert MRSA Assay (FDA approved for NASAL specimens only), is one component of a comprehensive MRSA colonization surveillance program. It is not intended to diagnose MRSA infection nor to guide or monitor treatment for MRSA infections.          Radiology Studies: No results found.      Scheduled Meds: . heparin  5,000 Units Subcutaneous Q8H  . insulin aspart  0-9 Units Subcutaneous TID WC  . levothyroxine  25 mcg Oral QAC breakfast  . sodium chloride flush  3 mL Intravenous Q12H   Continuous Infusions: .  sodium bicarbonate  infusion 1000 mL 125 mL/hr at 06/12/16 0804     LOS: 2 days    Time spent: 25 minutes. Greater than 50% of this time was spent in direct contact with the patient coordinating care.     Lelon Frohlich, MD [facility] Pager [phone]  If 7PM-7AM, please contact night-coverage [email or web address] Password [email or web address] 06/12/2016, 2:03 PM

## 2016-06-12 NOTE — Clinical Social Work Note (Signed)
Clinical Social Work Assessment  Patient Details  Name: [name] MRN: [id number] Date of Birth: Dec 31, 1932  Date of referral:  06/12/16               Reason for consult:  End of Life/Hospice                Permission sought to share information with:  Family Supports Permission granted to share information::     Name::     MetallurgistTatia  Agency::     Relationship::  Daughter   Contact Information:  [phone]  Housing/Transportation Living arrangements for the past 2 months:  Single Family Home Source of Information:  Adult Children Patient Interpreter Needed:  None Criminal Activity/Legal Involvement Pertinent to Current Situation/Hospitalization:  No - Comment as needed Significant Relationships:  Adult Children Lives with:  Adult Children Do you feel safe going back to the place where you live?  Yes Need for family participation in patient care:  Yes (Comment)  Care giving concerns: Residential Placement/Hospice care   Social Worker assessment / plan:  CSW spoke with [place] patient daughter about residential hospice placement. Patient [facility] because [facility] is [facility] family. [facility] gave referral. She plans to follow up with csw about availability. Patient daughter reports [facility] is second choice, then with hospice if beds are not available.  Plan: Assist with discharge to residential hospice.   Employment status:  Retired Database administratornsurance information:  Managed Medicare PT Recommendations:  Not assessed at this time Information / Referral to community resources:     Patient/Family's Response to care: Agreeable.   Patient/Family's Understanding of and Emotional Response to Diagnosis, Current Treatment, and Prognosis:  Patient daughter involved in patient care. Daughter wants patient to be close to family during her last days.   Emotional Assessment Appearance:  Developmentally  appropriate Attitude/Demeanor/Rapport:  Unable to Assess Affect (typically observed):  Unable to Assess Orientation:    Alcohol / Substance use:  Not Applicable Psych involvement (Current and /or in the community):  No (Comment)  Discharge Needs  Concerns to be addressed:  Discharge Planning Concerns Readmission within the last 30 days:  No Current discharge risk:  None Barriers to Discharge:  Continued Medical Work up   [employer] A Jabier Deese, LCSW 06/12/2016, 1:04 PM

## 2016-06-13 LAB — GLUCOSE, CAPILLARY
Glucose-Capillary: 187 mg/dL — ABNORMAL HIGH (ref 65–99)
Glucose-Capillary: 218 mg/dL — ABNORMAL HIGH (ref 65–99)

## 2016-06-13 NOTE — Care Management Important Message (Signed)
Important Message  Patient Details  Name: [name] MRN: [id number] Date of Birth: 1932-07-16   Medicare Important Message Given:  Yes    Elliot CousinShavis, Kaaliyah Kita Ellen, RN 06/13/2016, 11:58 AM

## 2016-06-13 NOTE — Progress Notes (Signed)
CSW received phone call from [name] [facility]. Family completed paperwork at facility and are able to transfer today. CSW will udpate RN and complete Med Necessity form.  Stacy GardnerErin Iasiah Ozment, LCSWA Clinical Social Worker [phone]

## 2016-06-13 NOTE — Care Management Note (Signed)
Case Management Note  Patient Details  Name: [name] MRN: [id number] Date of Birth: 11-05-1932  Subjective/Objective:   ARF, FTT                 Action/Plan: Discharge Planning: Chart reveiwed. CSW following for [facility].   PCP Nadyne CoombesBREWINGTON, THOMAS MD  Expected Discharge Date:  06/13/16               Expected Discharge Plan:  Hospice Medical Facility  In-House Referral:  Clinical Social Work  Discharge planning Services  CM Consult  Post Acute Care Choice:  NA Choice offered to:  NA  DME Arranged:  N/A DME Agency:  NA  HH Arranged:  NA HH Agency:  NA  Status of Service:  Completed, signed off  If discussed at Long Length of Stay Meetings, dates discussed:    Additional Comments:  Elliot CousinShavis, Ahni Bradwell Ellen, RN 06/13/2016, 12:00 PM

## 2016-06-13 NOTE — Progress Notes (Signed)
[facility]. Daughter, Rodney Boozeasha, aware of transportation. Discharge packet given to RN, Nehemiah SettleBrooke.  [facility] called for transport.   Stacy GardnerErin Aasia Peavler, LCSWA Clinical Social Worker [phone]

## 2016-06-13 NOTE — Progress Notes (Signed)
Per RN, [facility]  Stacy GardnerErin Yeudiel Mateo, LCSWA Clinical Social Worker [phone]

## 2016-06-13 NOTE — Progress Notes (Signed)
[facility] arrived to take patient.  Earnest ConroyBrooke M. Clelia CroftShaw, RN

## 2016-06-13 NOTE — Progress Notes (Signed)
Daily Progress Note   Patient Name: [name]       Date: 06/13/2016 DOB: 1932/03/23  Age: 83 y.o. MRN#: [id number] Attending Physician: Philip Aspen, Minerva Ends* Primary Care Physician: Nadyne Coombes, MD Admit Date: 06/10/2016  Reason for Consultation/Follow-up: Establishing goals of care  Subjective:  appears weak, lethargic, responds some Daughter and niece at bedside See below  Length of Stay: 3  Current Medications: Scheduled Meds:  . heparin  5,000 Units Subcutaneous Q8H  . insulin aspart  0-9 Units Subcutaneous TID WC  . levothyroxine  25 mcg Oral QAC breakfast  . sodium chloride flush  3 mL Intravenous Q12H    Continuous Infusions: .  sodium bicarbonate  infusion 1000 mL 125 mL/hr at 06/13/16 0412    PRN Meds: acetaminophen **OR** acetaminophen, albuterol, ondansetron **OR** ondansetron (ZOFRAN) IV  Physical Exam         Weak frail elderly lady S1 S2 Shallow clear Abdomen soft No edema Awake  Vital Signs: BP 103/77 (BP Location: Left Arm)   Pulse 85   Temp 97.8 F (36.6 C) (Oral)   Resp 16   Ht 5\' 3"  (1.6 m)   Wt 45.7 kg (100 lb 12 oz)   SpO2 94%   BMI 17.85 kg/m  SpO2: SpO2: 94 % O2 Device: O2 Device: Nasal Cannula O2 Flow Rate: O2 Flow Rate (L/min): 5 L/min  Intake/output summary:   Intake/Output Summary (Last 24 hours) at 06/13/16 1002 Last data filed at 06/13/16 0320  Gross per 24 hour  Intake             2740 ml  Output             2250 ml  Net              490 ml   LBM: Last BM Date: 06/10/16 Baseline Weight: Weight: 49.9 kg (110 lb) Most recent weight: Weight: 45.7 kg (100 lb 12 oz)       Palliative Assessment/Data:    Flowsheet Rows     Most Recent Value  Intake Tab  Referral Department  Hospitalist  Unit at  Time of Referral  Intermediate Care Unit  Palliative Care Primary Diagnosis  Other (Comment) [dementia, renal failure, diarrhea, not dialysis candidate. ]  Date Notified  06/10/16  Palliative Care Type  New  Palliative care  Reason for referral  Clarify Goals of Care, Non-pain Symptom, Counsel Regarding Hospice  Date of Admission  06/10/16  Date first seen by Palliative Care  06/11/16  # of days Palliative referral response time  1 Day(s)  # of days IP prior to Palliative referral  0  Clinical Assessment  Palliative Performance Scale Score  20%  Pain Max last 24 hours  4  Pain Min Last 24 hours  3  Dyspnea Max Last 24 Hours  4  Dyspnea Min Last 24 hours  3  Nausea Max Last 24 Hours  3  Nausea Min Last 24 Hours  2  Psychosocial & Spiritual Assessment  Palliative Care Outcomes  Patient/Family meeting held?  Yes  Who was at the meeting?  patient, daughter Mickie Bailosha who is her primary caregiver.   Palliative Care Outcomes  Clarified goals of care      Patient Active Problem List   Diagnosis Date Noted  . Encounter for palliative care   . Goals of care, counseling/discussion   . AKI (acute kidney injury) (HCC)   . ARF (acute renal failure) (HCC) 06/10/2016  . Diarrhea 06/10/2016  . Dementia 06/10/2016  . HTN (hypertension) 06/10/2016  . DM (diabetes mellitus) (HCC) 06/10/2016  . Failure to thrive (0-17) 06/10/2016  . Closed right hip fracture (HCC) 06/10/2016    Palliative Care Assessment & Plan   Patient Profile:   Assessment:  83 y.o.femalewith medical history significant of advanced dementia-mostly bed bound-dependent on her daughter for all daily activities of living brought to the emergency room for evaluation of worsening lethargy and confusion along with diarrhea Has dementia with gradual progressive decline Acute renal failure High anion GAP- resolving with IVF Functional decline Lack of eating, did tolerate some jello today Low blood  sugars  Recommendations/Plan:  Discussed again with daughter and niece in the room, explained that the patient is likely rallying, from the IVF and bicarb and other artifical hydration/medications that were employed in the hospital, discussed about end stage dementia, worsening renal function.  Discussed about end of life signs and symptoms In my opinion, discharge to residential hospice would be the preferred d/c option, the daughter also is the primary caregiver for her dad, the patient's husband who reportedly also has advanced dementia.    Code Status:    Code Status Orders        Start     Ordered   06/10/16 1756  Do not attempt resuscitation (DNR)  Continuous    Question Answer Comment  In the event of cardiac or respiratory ARREST Do not call a "code blue"   In the event of cardiac or respiratory ARREST Do not perform Intubation, CPR, defibrillation or ACLS   In the event of cardiac or respiratory ARREST Use medication by any route, position, wound care, and other measures to relive pain and suffering. May use oxygen, suction and manual treatment of airway obstruction as needed for comfort.      06/10/16 1755    Code Status History    Date Active Date Inactive Code Status Order ID Comments User Context   This patient has a current code status but no historical code status.       Prognosis:   < 2 weeks  Discharge Planning:  Hospice facility  Care plan was discussed with  Daughter, niece, [facility]   Thank you for allowing the Palliative Medicine Team to assist in the care of this patient.   Time  In: 9.25 Time Out: 10 Total Time 35 Prolonged Time Billed  no       Greater than 50%  of this time was spent counseling and coordinating care related to the above assessment and plan.  Rosalin Hawking, MD [phone]  Please contact Palliative Medicine Team phone at [phone] for questions and concerns.

## 2016-06-13 NOTE — Progress Notes (Signed)
[facility]  [facility] room available today. Family toured and are agreeable to transfer. Discharge summary has been faxed.  RN Please call report to [phone].  Thank you,  Forrestine Himva Davis, LCSW [phone]

## 2016-06-13 NOTE — Discharge Summary (Signed)
Physician Discharge Summary  [name] EZM:[id number] DOB: [birth year] DOA: 06/10/2016  PCP: Rhonda Kussmaul, MD  Admit date: 06/10/2016 Discharge date: 06/13/2016  Time spent: 45 minutes  Recommendations for Outpatient Follow-up:  -Will be discharged to residential Hospice today.   Discharge Diagnoses:  Principal Problem:   ARF (acute renal failure) (HCC) Active Problems:   Diarrhea   Dementia   HTN (hypertension)   DM (diabetes mellitus) ([place])   Failure to thrive (0-17)   Closed right hip fracture (HCC)   Encounter for palliative care   Goals of care, counseling/discussion   AKI (acute kidney injury) [facility])   Discharge Condition: Guarded  Filed Weights   06/10/16 1018 06/10/16 1818 06/11/16 0446  Weight: 49.9 kg (110 lb) 45.7 kg (100 lb 12 oz) 45.7 kg (100 lb 12 oz)    History of present illness:  As per Dr. Sloan Dorsey on 5/17: [name] is a 83 y.o. female with medical history significant of advanced dementia-mostly bed bound-dependent on her daughter for all daily activities of living brought to the emergency room for evaluation of worsening lethargy and confusion along with diarrhea. Please note, patient is not able to participate in the history taking process due to advanced dementia, most of this history is obtained from the daughter. As noted above, the patient is dependent on her daughter for all her daily activities of living-she is essentially bedbound-requires assistance to even be put into a chair at bedside. Approximately 3 days back, patient developed profuse loose watery stools-approximately 8 times in a 24-hour period. She also had some nausea and apparently 2 episodes of vomiting so far. Her appetite markedly decreased over the past few days. This morning, she was noted to be very lethargic and hard to arouse, as a result the patient was brought to the emergency room for further evaluation and treatment.  The daughter has not noted fever,  cough. Per daughter, patient has not complained of any chest pain or abdominal pain. No sick contacts, patient's husband also has advanced dementia but he does not have diarrhea/GI symptoms.  Per patient's daughter, patient has been gradually losing weight for the past few months-and she thinks patient has lost a significant amount of weight in the past few days.  At baseline-patient is incontinent of both urine and stool.  ED Course:  In the emergency room, patient was found to have a creatinine of 6.56 and a bicarbonate of 9. She was profoundly hypothermic with a rectal temp of 92.66F. Patient was given 2 L of normal saline, placed on a bear hugger, placed on 100% nonrebreather mask and the hospitalist service was subsequently asked to admit this patient for further evaluation and treatment.  Hospital Course:   ARF with GAP Metabolic Acidosis -Renal function without significant improvement despite IVF. -Has been deemed a poor dialysis candidate (I agree). -Palliative care has met with patient's daughter and they would like to pursue residential hospice placement. SW is aware.  Diarrhea -No diarrhea since admission. -DC cipro/flagyl.  Hypothermia -Resolved. -TSH/cortisol WNL.  Dementia -Appears to be at baseline. -Seems pleasant without behavioral disturbances.  HTN -Well controlled. -Not currently on any medications.  Adult FTT -Due to metabolic derrangements on top of baseline dementia.  Hypothyroidism -Continue synthroid. -TSH WNL.   Procedures:  None   Consultations:  Palliative Medicine  Discharge Instructions  Discharge Instructions    Diet - low sodium heart healthy    Complete by:  As directed    Increase activity  slowly    Complete by:  As directed      Allergies as of 06/13/2016   No Known Allergies     Medication List    STOP taking these medications   benazepril-hydrochlorthiazide 20-12.5 MG tablet Commonly known as:  LOTENSIN  HCT   Enalapril-Hydrochlorothiazide 5-12.5 MG tablet   fish oil-omega-3 fatty acids 1000 MG capsule   levothyroxine 25 MCG tablet Commonly known as:  SYNTHROID, LEVOTHROID   metFORMIN 1000 MG tablet Commonly known as:  GLUCOPHAGE   simvastatin 40 MG tablet Commonly known as:  ZOCOR      No Known Allergies    The results of significant diagnostics from this hospitalization (including imaging, microbiology, ancillary and laboratory) are listed below for reference.    Significant Diagnostic Studies: Ct Abdomen Pelvis Wo Contrast  Result Date: 06/10/2016 CLINICAL DATA:  Nausea and vomiting for 3 days EXAM: CT CHEST, ABDOMEN AND PELVIS WITHOUT CONTRAST TECHNIQUE: Multidetector CT imaging of the chest, abdomen and pelvis was performed following the standard protocol without IV contrast. COMPARISON:  None. FINDINGS: CT CHEST FINDINGS Cardiovascular: Diffuse aortic calcifications are noted. No aneurysmal dilatation is seen. Coronary calcifications are noted. No cardiac enlargement is seen. Mediastinum/Nodes: Thoracic inlet demonstrates some heterogeneity of the right thyroid gland. No discrete nodule is seen. No hilar or mediastinal adenopathy is noted. Lungs/Pleura: Lungs are well aerated bilaterally. Mild septal thickening is noted likely of a chronic nature. Mild right basilar infiltrative change is seen posteriorly. No sizable effusion is noted. Musculoskeletal: Degenerative changes of thoracic spine are noted. No acute compression deformity is seen. No definitive rib abnormality is noted. CT ABDOMEN PELVIS FINDINGS Hepatobiliary: No focal liver abnormality is seen. No gallstones, gallbladder wall thickening, or biliary dilatation. Pancreas: Unremarkable. No pancreatic ductal dilatation or surrounding inflammatory changes. Spleen: Normal in size without focal abnormality. Adrenals/Urinary Tract: Adrenal glands are within normal limits. Nonobstructing right renal stones are seen. A dominant  right renal cyst is noted inferiorly measuring approximately 2.8 cm. A hyperdense 10 mm lesion is noted in the left kidney likely representing hyperdense cyst. Bladder is well distended. Stomach/Bowel: No obstructive changes are seen. No inflammatory changes are seen. The appendix is not well appreciated. Stomach is well distended. A small sliding-type hiatal hernia is noted. Vascular/Lymphatic: Aortic atherosclerosis. No enlarged abdominal or pelvic lymph nodes. Reproductive: The uterus has been removed. Foley catheter is seen this placed within the vaginal cuff. Clinical correlation with urine output is recommended. Other: No abdominal wall hernia or abnormality. No abdominopelvic ascites. Musculoskeletal: Degenerative changes of the lumbar spine are noted. No compression deformity is seen. IMPRESSION: Foley catheter appears to be placed within the vaginal cuff. Correlation with urine output is recommended. The bladder remains well distended. Nonobstructing right renal stones. Bilateral renal cysts. No other acute abnormality is noted. Electronically Signed   By: Alcide Clever M.D.   On: 06/10/2016 11:26   Ct Head Wo Contrast  Result Date: 06/10/2016 CLINICAL DATA:  Altered mental status. Nausea, vomiting, and diarrhea for 3 days. History of Alzheimer's. EXAM: CT HEAD WITHOUT CONTRAST TECHNIQUE: Contiguous axial images were obtained from the base of the skull through the vertex without intravenous contrast. COMPARISON:  Brain MRI 05/17/2011 FINDINGS: Brain: Lateral and third ventriculomegaly is unchanged and felt to reflect moderate cerebral atrophy rather than hydrocephalus. Periventricular white matter hypodensities are nonspecific but compatible with mild-to-moderate chronic small vessel ischemic disease. A chronic lacunar infarct is again noted in the right thalamus. There is no evidence of acute infarct, intracranial  hemorrhage, mass, midline shift, or extra-axial fluid collection. Vascular: Calcified  atherosclerosis at the skullbase. No hyperdense vessel. Skull: No fracture or focal osseous lesion. Sinuses/Orbits: Chronic left sphenoid sinusitis. Postoperative changes to both globes. Clear mastoid air cells. Other: None. IMPRESSION: 1. No evidence of acute intracranial abnormality. 2. Mild-to-moderate chronic small vessel ischemic disease and moderate cerebral atrophy. Electronically Signed   By: Logan Bores M.D.   On: 06/10/2016 11:18   Ct Chest Wo Contrast  Result Date: 06/10/2016 CLINICAL DATA:  Nausea and vomiting for 3 days EXAM: CT CHEST, ABDOMEN AND PELVIS WITHOUT CONTRAST TECHNIQUE: Multidetector CT imaging of the chest, abdomen and pelvis was performed following the standard protocol without IV contrast. COMPARISON:  None. FINDINGS: CT CHEST FINDINGS Cardiovascular: Diffuse aortic calcifications are noted. No aneurysmal dilatation is seen. Coronary calcifications are noted. No cardiac enlargement is seen. Mediastinum/Nodes: Thoracic inlet demonstrates some heterogeneity of the right thyroid gland. No discrete nodule is seen. No hilar or mediastinal adenopathy is noted. Lungs/Pleura: Lungs are well aerated bilaterally. Mild septal thickening is noted likely of a chronic nature. Mild right basilar infiltrative change is seen posteriorly. No sizable effusion is noted. Musculoskeletal: Degenerative changes of thoracic spine are noted. No acute compression deformity is seen. No definitive rib abnormality is noted. CT ABDOMEN PELVIS FINDINGS Hepatobiliary: No focal liver abnormality is seen. No gallstones, gallbladder wall thickening, or biliary dilatation. Pancreas: Unremarkable. No pancreatic ductal dilatation or surrounding inflammatory changes. Spleen: Normal in size without focal abnormality. Adrenals/Urinary Tract: Adrenal glands are within normal limits. Nonobstructing right renal stones are seen. A dominant right renal cyst is noted inferiorly measuring approximately 2.8 cm. A hyperdense 10 mm  lesion is noted in the left kidney likely representing hyperdense cyst. Bladder is well distended. Stomach/Bowel: No obstructive changes are seen. No inflammatory changes are seen. The appendix is not well appreciated. Stomach is well distended. A small sliding-type hiatal hernia is noted. Vascular/Lymphatic: Aortic atherosclerosis. No enlarged abdominal or pelvic lymph nodes. Reproductive: The uterus has been removed. Foley catheter is seen this placed within the vaginal cuff. Clinical correlation with urine output is recommended. Other: No abdominal wall hernia or abnormality. No abdominopelvic ascites. Musculoskeletal: Degenerative changes of the lumbar spine are noted. No compression deformity is seen. IMPRESSION: Foley catheter appears to be placed within the vaginal cuff. Correlation with urine output is recommended. The bladder remains well distended. Nonobstructing right renal stones. Bilateral renal cysts. No other acute abnormality is noted. Electronically Signed   By: Inez Catalina M.D.   On: 06/10/2016 11:26   Dg Chest Port 1 View  Result Date: 06/10/2016 CLINICAL DATA:  Altered mental status. EXAM: PORTABLE CHEST 1 VIEW COMPARISON:  10/11/2007 FINDINGS: Heart size is normal. There is aortic atherosclerosis. The lungs are clear. The vascularity is normal. No effusions. No significant bone finding. IMPRESSION: No active disease.  Aortic atherosclerosis. Electronically Signed   By: Nelson Chimes M.D.   On: 06/10/2016 10:05    Microbiology: Recent Results (from the past 240 hour(s))  Blood Culture (routine x 2)     Status: None (Preliminary result)   Collection Time: 06/10/16  9:52 AM  Result Value Ref Range Status   Specimen Description BLOOD LEFT ANTECUBITAL  Final   Special Requests   Final    BOTTLES DRAWN AEROBIC AND ANAEROBIC Blood Culture adequate volume   Culture   Final    NO GROWTH 2 DAYS Performed at [facility], [address] [place], [place] [address]  Report Status  PENDING  Incomplete  Blood Culture (routine x 2)     Status: None (Preliminary result)   Collection Time: 06/10/16 10:11 AM  Result Value Ref Range Status   Specimen Description BLOOD RIGHT ARM  Final   Special Requests AEROBIC BOTTLE ONLY Blood Culture adequate volume  Final   Culture   Final    NO GROWTH 2 DAYS Performed at [facility], [address], [place], [place] [address]    Report Status PENDING  Incomplete  Gastrointestinal Panel by PCR , Stool     Status: None   Collection Time: 06/10/16  6:13 PM  Result Value Ref Range Status   Campylobacter species NOT DETECTED NOT DETECTED Final   Plesimonas shigelloides NOT DETECTED NOT DETECTED Final   Salmonella species NOT DETECTED NOT DETECTED Final   Yersinia enterocolitica NOT DETECTED NOT DETECTED Final   Vibrio species NOT DETECTED NOT DETECTED Final   Vibrio cholerae NOT DETECTED NOT DETECTED Final   Enteroaggregative E coli (EAEC) NOT DETECTED NOT DETECTED Final   Enteropathogenic E coli (EPEC) NOT DETECTED NOT DETECTED Final   Enterotoxigenic E coli (ETEC) NOT DETECTED NOT DETECTED Final   Shiga like toxin producing E coli (STEC) NOT DETECTED NOT DETECTED Final   Shigella/Enteroinvasive E coli (EIEC) NOT DETECTED NOT DETECTED Final   Cryptosporidium NOT DETECTED NOT DETECTED Final   Cyclospora cayetanensis NOT DETECTED NOT DETECTED Final   Entamoeba histolytica NOT DETECTED NOT DETECTED Final   Giardia lamblia NOT DETECTED NOT DETECTED Final   Adenovirus F40/41 NOT DETECTED NOT DETECTED Final   Astrovirus NOT DETECTED NOT DETECTED Final   Norovirus GI/GII NOT DETECTED NOT DETECTED Final   Rotavirus A NOT DETECTED NOT DETECTED Final   Sapovirus (I, II, IV, and V) NOT DETECTED NOT DETECTED Final  MRSA PCR Screening     Status: None   Collection Time: 06/10/16  6:20 PM  Result Value Ref Range Status   MRSA by PCR NEGATIVE NEGATIVE Final    Comment:        The GeneXpert MRSA Assay (FDA approved for NASAL  specimens only), is one component of a comprehensive MRSA colonization surveillance program. It is not intended to diagnose MRSA infection nor to guide or monitor treatment for MRSA infections.      Labs: Basic Metabolic Panel:  Recent Labs Lab 06/10/16 0952 06/10/16 1012 06/11/16 0325 06/12/16 0538  NA 136 135 138 139  K 5.3* 5.3* 5.1 3.9  CL 101 107 100* 92*  CO2 9*  --  10* 30  GLUCOSE 211* 202* 202* 110*  BUN 82* 84* 83* 79*  CREATININE 6.56* 7.10* 6.66* 6.32*  CALCIUM 9.7  --  8.6* 8.0*   Liver Function Tests:  Recent Labs Lab 06/10/16 0952  AST 27  ALT 9*  ALKPHOS 60  BILITOT 0.6  PROT 8.2*  ALBUMIN 3.8   No results for input(s): LIPASE, AMYLASE in the last 168 hours. No results for input(s): AMMONIA in the last 168 hours. CBC:  Recent Labs Lab 06/10/16 0952 06/10/16 1012 06/11/16 0325  WBC 13.9*  --  14.5*  NEUTROABS 12.2*  --   --   HGB 9.8* 10.5* 9.9*  HCT 31.4* 31.0* 29.6*  MCV 94.6  --  89.7  PLT 289  --  245   Cardiac Enzymes: No results for input(s): CKTOTAL, CKMB, CKMBINDEX, TROPONINI in the last 168 hours. BNP: BNP (last 3 results) No results for input(s): BNP in the last 8760 hours.  ProBNP (last 3 results) No results for input(s): PROBNP in the last 8760 hours.  CBG:  Recent Labs Lab 06/12/16 0837 06/12/16 1200 06/12/16 1625 06/12/16 2050 06/13/16 0740  GLUCAP 99 125* 179* 224* 187*       Signed:  HERNANDEZ ACOSTA,Delaina Fetsch  [facility] Pager: [phone] 06/13/2016, 10:38 AM

## 2016-06-15 LAB — CULTURE, BLOOD (ROUTINE X 2)
Culture: NO GROWTH
Culture: NO GROWTH
Special Requests: ADEQUATE
Special Requests: ADEQUATE

## 2016-07-25 DEATH — deceased
